# Patient Record
Sex: Female | Born: 1970 | Race: White | Hispanic: No | Marital: Single | Smoking: Former smoker
Health system: Southern US, Community
[De-identification: ages and names within clinical notes are randomized; demographics above are authoritative.]

## PROBLEM LIST (undated history)

## (undated) DIAGNOSIS — E669 Obesity, unspecified: Secondary | ICD-10-CM

## (undated) DIAGNOSIS — K219 Gastro-esophageal reflux disease without esophagitis: Secondary | ICD-10-CM

## (undated) DIAGNOSIS — I10 Essential (primary) hypertension: Secondary | ICD-10-CM

## (undated) DIAGNOSIS — F419 Anxiety disorder, unspecified: Secondary | ICD-10-CM

## (undated) DIAGNOSIS — G4730 Sleep apnea, unspecified: Secondary | ICD-10-CM

## (undated) DIAGNOSIS — N939 Abnormal uterine and vaginal bleeding, unspecified: Secondary | ICD-10-CM

## (undated) DIAGNOSIS — F329 Major depressive disorder, single episode, unspecified: Secondary | ICD-10-CM

## (undated) DIAGNOSIS — R55 Syncope and collapse: Secondary | ICD-10-CM

## (undated) DIAGNOSIS — F32A Depression, unspecified: Secondary | ICD-10-CM

## (undated) DIAGNOSIS — E119 Type 2 diabetes mellitus without complications: Secondary | ICD-10-CM

## (undated) DIAGNOSIS — Z87898 Personal history of other specified conditions: Secondary | ICD-10-CM

## (undated) DIAGNOSIS — G4733 Obstructive sleep apnea (adult) (pediatric): Secondary | ICD-10-CM

## (undated) DIAGNOSIS — K5790 Diverticulosis of intestine, part unspecified, without perforation or abscess without bleeding: Secondary | ICD-10-CM

## (undated) HISTORY — DX: Essential (primary) hypertension: I10

## (undated) HISTORY — DX: Major depressive disorder, single episode, unspecified: F32.9

## (undated) HISTORY — DX: Anxiety disorder, unspecified: F41.9

## (undated) HISTORY — PX: ESOPHAGOGASTRODUODENOSCOPY: SHX1529

## (undated) HISTORY — DX: Type 2 diabetes mellitus without complications: E11.9

## (undated) HISTORY — DX: Depression, unspecified: F32.A

## (undated) HISTORY — PX: COLONOSCOPY: SHX174

---

## 1998-04-20 ENCOUNTER — Inpatient Hospital Stay (HOSPITAL_COMMUNITY): Admission: AD | Admit: 1998-04-20 | Discharge: 1998-04-23 | Payer: Self-pay | Admitting: Obstetrics & Gynecology

## 2004-03-05 ENCOUNTER — Other Ambulatory Visit: Admission: RE | Admit: 2004-03-05 | Discharge: 2004-03-05 | Payer: Self-pay | Admitting: Obstetrics and Gynecology

## 2004-08-12 ENCOUNTER — Emergency Department (HOSPITAL_COMMUNITY): Admission: EM | Admit: 2004-08-12 | Discharge: 2004-08-13 | Payer: Self-pay | Admitting: Emergency Medicine

## 2004-08-28 ENCOUNTER — Ambulatory Visit: Payer: Self-pay | Admitting: Internal Medicine

## 2004-09-04 ENCOUNTER — Ambulatory Visit: Payer: Self-pay | Admitting: Family Medicine

## 2008-05-05 ENCOUNTER — Ambulatory Visit: Payer: Self-pay | Admitting: General Surgery

## 2008-05-16 ENCOUNTER — Ambulatory Visit: Payer: Self-pay | Admitting: General Surgery

## 2008-05-29 ENCOUNTER — Ambulatory Visit: Payer: Self-pay | Admitting: General Surgery

## 2008-05-29 HISTORY — PX: CHOLECYSTECTOMY: SHX55

## 2008-05-29 HISTORY — PX: LAPAROSCOPIC CHOLECYSTECTOMY: SUR755

## 2009-10-12 ENCOUNTER — Emergency Department: Payer: Self-pay

## 2009-11-05 ENCOUNTER — Ambulatory Visit: Payer: Self-pay | Admitting: Gastroenterology

## 2009-11-05 DIAGNOSIS — K5790 Diverticulosis of intestine, part unspecified, without perforation or abscess without bleeding: Secondary | ICD-10-CM

## 2009-11-05 HISTORY — DX: Diverticulosis of intestine, part unspecified, without perforation or abscess without bleeding: K57.90

## 2009-11-05 LAB — HM COLONOSCOPY

## 2009-11-21 ENCOUNTER — Inpatient Hospital Stay: Payer: Self-pay | Admitting: Internal Medicine

## 2011-03-14 ENCOUNTER — Emergency Department: Payer: Self-pay | Admitting: Emergency Medicine

## 2014-06-22 ENCOUNTER — Emergency Department: Payer: Self-pay | Admitting: Emergency Medicine

## 2014-06-22 LAB — URINALYSIS, COMPLETE
BACTERIA: NONE SEEN
BILIRUBIN, UR: NEGATIVE
Glucose,UR: 50 mg/dL (ref 0–75)
Ketone: NEGATIVE
Leukocyte Esterase: NEGATIVE
NITRITE: NEGATIVE
PROTEIN: NEGATIVE
Ph: 7 (ref 4.5–8.0)
RBC, UR: NONE SEEN /HPF (ref 0–5)
Specific Gravity: 1.003 (ref 1.003–1.030)
Squamous Epithelial: 1

## 2014-06-22 LAB — COMPREHENSIVE METABOLIC PANEL
ALBUMIN: 3.4 g/dL (ref 3.4–5.0)
ANION GAP: 7 (ref 7–16)
Alkaline Phosphatase: 87 U/L
BUN: 8 mg/dL (ref 7–18)
Bilirubin,Total: 0.4 mg/dL (ref 0.2–1.0)
CREATININE: 0.89 mg/dL (ref 0.60–1.30)
Calcium, Total: 8.8 mg/dL (ref 8.5–10.1)
Chloride: 103 mmol/L (ref 98–107)
Co2: 25 mmol/L (ref 21–32)
EGFR (African American): >60
EGFR (Non-African Amer.): >60
Glucose: 190 mg/dL — ABNORMAL HIGH (ref 65–99)
Osmolality: 274 (ref 275–301)
Potassium: 4.1 mmol/L (ref 3.5–5.1)
SGOT(AST): 26 U/L (ref 15–37)
SGPT (ALT): 16 U/L
Sodium: 135 mmol/L — ABNORMAL LOW (ref 136–145)
Total Protein: 8.3 g/dL — ABNORMAL HIGH (ref 6.4–8.2)

## 2014-06-22 LAB — CBC
HCT: 41.4 % (ref 35.0–47.0)
HGB: 13.7 g/dL (ref 12.0–16.0)
MCH: 30.2 pg (ref 26.0–34.0)
MCHC: 33.2 g/dL (ref 32.0–36.0)
MCV: 91 fL (ref 80–100)
Platelet: 298 10*3/uL (ref 150–440)
RBC: 4.55 10*6/uL (ref 3.80–5.20)
RDW: 13.4 % (ref 11.5–14.5)
WBC: 10.4 10*3/uL (ref 3.6–11.0)

## 2014-06-22 LAB — LIPASE, BLOOD: LIPASE: 116 U/L (ref 73–393)

## 2015-02-01 ENCOUNTER — Other Ambulatory Visit: Payer: Self-pay | Admitting: Obstetrics and Gynecology

## 2015-02-01 DIAGNOSIS — R1900 Intra-abdominal and pelvic swelling, mass and lump, unspecified site: Secondary | ICD-10-CM

## 2015-02-02 ENCOUNTER — Other Ambulatory Visit: Payer: Self-pay | Admitting: Obstetrics and Gynecology

## 2015-02-02 DIAGNOSIS — R928 Other abnormal and inconclusive findings on diagnostic imaging of breast: Secondary | ICD-10-CM

## 2015-02-08 ENCOUNTER — Other Ambulatory Visit: Payer: Self-pay

## 2015-02-09 ENCOUNTER — Other Ambulatory Visit: Payer: Self-pay

## 2015-03-30 ENCOUNTER — Other Ambulatory Visit: Payer: Self-pay

## 2015-04-13 ENCOUNTER — Other Ambulatory Visit: Payer: Self-pay

## 2015-04-13 ENCOUNTER — Ambulatory Visit
Admission: RE | Admit: 2015-04-13 | Discharge: 2015-04-13 | Disposition: A | Discharge status: Home / Self Care | Payer: BLUE CROSS/BLUE SHIELD | Source: Ambulatory Visit | Attending: Obstetrics and Gynecology | Admitting: Obstetrics and Gynecology

## 2015-04-13 DIAGNOSIS — R928 Other abnormal and inconclusive findings on diagnostic imaging of breast: Secondary | ICD-10-CM

## 2015-04-27 ENCOUNTER — Other Ambulatory Visit: Payer: Self-pay | Admitting: Family Medicine

## 2015-04-27 DIAGNOSIS — I10 Essential (primary) hypertension: Secondary | ICD-10-CM

## 2015-06-06 ENCOUNTER — Other Ambulatory Visit: Payer: Self-pay | Admitting: Family Medicine

## 2015-06-06 DIAGNOSIS — F4329 Adjustment disorder with other symptoms: Secondary | ICD-10-CM

## 2015-06-14 ENCOUNTER — Other Ambulatory Visit: Payer: Self-pay | Admitting: Family Medicine

## 2015-06-14 DIAGNOSIS — F419 Anxiety disorder, unspecified: Secondary | ICD-10-CM

## 2015-06-14 NOTE — Telephone Encounter (Signed)
Last OV 06/2013  Thanks,   -Clark Clowdus  

## 2015-06-14 NOTE — Telephone Encounter (Signed)
Ok to call in rx.  Thanks.  

## 2015-07-12 ENCOUNTER — Other Ambulatory Visit: Payer: Self-pay | Admitting: Family Medicine

## 2015-07-12 NOTE — Telephone Encounter (Signed)
This is a pt of Dr. Santiago Bur. Last ov was on 07/11/2013. Last time the prescription was refilled was on 03/15/2015.  Thanks,

## 2015-07-17 ENCOUNTER — Other Ambulatory Visit: Payer: Self-pay | Admitting: Family Medicine

## 2015-07-17 DIAGNOSIS — F419 Anxiety disorder, unspecified: Secondary | ICD-10-CM

## 2015-07-17 MED ORDER — ALPRAZOLAM 0.5 MG PO TABS: ORAL_TABLET | ORAL | Status: DC

## 2015-07-17 NOTE — Telephone Encounter (Signed)
Pt contacted office for refill request on the following medications:  ALPRAZolam (XANAX) 0.5 MG. [facility] [address]  [phone]   It looks like this was sent to [place] last week/MW

## 2015-07-17 NOTE — Telephone Encounter (Signed)
Ok to call in rx.  Thanks.  

## 2015-08-13 ENCOUNTER — Other Ambulatory Visit: Payer: Self-pay | Admitting: Family Medicine

## 2015-08-14 ENCOUNTER — Other Ambulatory Visit: Payer: Self-pay | Admitting: Family Medicine

## 2015-08-14 DIAGNOSIS — F419 Anxiety disorder, unspecified: Secondary | ICD-10-CM

## 2015-08-15 ENCOUNTER — Telehealth: Payer: Self-pay | Admitting: Family Medicine

## 2015-08-15 DIAGNOSIS — F419 Anxiety disorder, unspecified: Secondary | ICD-10-CM

## 2015-08-15 MED ORDER — ALPRAZOLAM 0.5 MG PO TABS: ORAL_TABLET | ORAL | Status: DC

## 2015-08-15 NOTE — Telephone Encounter (Signed)
Pt contacted office for refill request on the following medications: ALPRAZolam (XANAX) 0.5 MG tablet to [facility] [address]. Pt stated she would like to get enough to last her until her appt on 08/17/15. Pt stated that she understands she hasn't been seen since 07/11/13 but she can't just be taken off this medication, she has to work. Thanks TNP

## 2015-08-15 NOTE — Telephone Encounter (Signed)
Ok to call in rx.  Thanks.  

## 2015-08-15 NOTE — Telephone Encounter (Signed)
RX called in.   Thanks,   -Laura  

## 2015-08-16 DIAGNOSIS — G4730 Sleep apnea, unspecified: Secondary | ICD-10-CM | POA: Insufficient documentation

## 2015-08-16 DIAGNOSIS — R079 Chest pain, unspecified: Secondary | ICD-10-CM | POA: Insufficient documentation

## 2015-08-16 DIAGNOSIS — E119 Type 2 diabetes mellitus without complications: Secondary | ICD-10-CM | POA: Insufficient documentation

## 2015-08-16 DIAGNOSIS — F32A Depression, unspecified: Secondary | ICD-10-CM | POA: Insufficient documentation

## 2015-08-16 DIAGNOSIS — L259 Unspecified contact dermatitis, unspecified cause: Secondary | ICD-10-CM | POA: Insufficient documentation

## 2015-08-16 DIAGNOSIS — F4320 Adjustment disorder, unspecified: Secondary | ICD-10-CM | POA: Insufficient documentation

## 2015-08-16 DIAGNOSIS — K589 Irritable bowel syndrome without diarrhea: Secondary | ICD-10-CM | POA: Insufficient documentation

## 2015-08-16 DIAGNOSIS — F329 Major depressive disorder, single episode, unspecified: Secondary | ICD-10-CM | POA: Insufficient documentation

## 2015-08-16 DIAGNOSIS — I10 Essential (primary) hypertension: Secondary | ICD-10-CM | POA: Insufficient documentation

## 2015-08-16 DIAGNOSIS — K5790 Diverticulosis of intestine, part unspecified, without perforation or abscess without bleeding: Secondary | ICD-10-CM | POA: Insufficient documentation

## 2015-08-16 DIAGNOSIS — E669 Obesity, unspecified: Secondary | ICD-10-CM | POA: Insufficient documentation

## 2015-08-16 DIAGNOSIS — R0602 Shortness of breath: Secondary | ICD-10-CM | POA: Insufficient documentation

## 2015-08-16 DIAGNOSIS — O039 Complete or unspecified spontaneous abortion without complication: Secondary | ICD-10-CM | POA: Insufficient documentation

## 2015-08-16 DIAGNOSIS — K529 Noninfective gastroenteritis and colitis, unspecified: Secondary | ICD-10-CM | POA: Insufficient documentation

## 2015-08-16 DIAGNOSIS — F410 Panic disorder [episodic paroxysmal anxiety] without agoraphobia: Secondary | ICD-10-CM | POA: Insufficient documentation

## 2015-08-17 ENCOUNTER — Ambulatory Visit (INDEPENDENT_AMBULATORY_CARE_PROVIDER_SITE_OTHER): Payer: BLUE CROSS/BLUE SHIELD | Admitting: Family Medicine

## 2015-08-17 ENCOUNTER — Encounter: Payer: Self-pay | Admitting: Family Medicine

## 2015-08-17 VITALS — BP 130/88 | HR 72 | Temp 98.2°F | Resp 16 | Ht 67.0 in | Wt 248.0 lb

## 2015-08-17 DIAGNOSIS — I10 Essential (primary) hypertension: Secondary | ICD-10-CM

## 2015-08-17 DIAGNOSIS — E119 Type 2 diabetes mellitus without complications: Secondary | ICD-10-CM | POA: Diagnosis not present

## 2015-08-17 DIAGNOSIS — F419 Anxiety disorder, unspecified: Secondary | ICD-10-CM

## 2015-08-17 LAB — POCT GLYCOSYLATED HEMOGLOBIN (HGB A1C)
Est. average glucose Bld gHb Est-mCnc: 154
Hemoglobin A1C: 7

## 2015-08-17 MED ORDER — LISINOPRIL 10 MG PO TABS: 10.0000 mg | ORAL_TABLET | Freq: Every day | ORAL | Status: DC

## 2015-08-17 MED ORDER — METFORMIN HCL 500 MG PO TABS
500.0000 mg | ORAL_TABLET | Freq: Two times a day (BID) | ORAL | Status: DC
Start: 2015-08-17 — End: 2016-01-26

## 2015-08-17 MED ORDER — ALPRAZOLAM 0.5 MG PO TABS: ORAL_TABLET | ORAL | Status: DC

## 2015-08-17 NOTE — Progress Notes (Signed)
Subjective:    Patient ID: Katherine Suarez, female    DOB: 1971/03/08, 44 y.o.   MRN: [id number]  Anxiety Presents for follow-up visit. Symptoms include nausea. Patient reports no chest pain, compulsions, confusion, decreased concentration, depressed mood, dry mouth, excessive worry, feeling of choking, hyperventilation, insomnia, irritability, malaise, muscle tension, nervous/anxious behavior, obsessions, palpitations, panic, restlessness, shortness of breath or suicidal ideas. Dizziness: occasionally. The quality of sleep is good (If takes medication).   Past treatments include benzodiazephines and SSRIs (Xanax 0.5 mg and Citalopram 20 mg. Takes Xanax at night.  ). The treatment provided moderate relief. Compliance with prior treatments has been good.  Hypertension Associated symptoms include anxiety and blurred vision. Pertinent negatives include no chest pain, malaise/fatigue, neck pain, orthopnea, palpitations, peripheral edema, shortness of breath or sweats. Risk factors for coronary artery disease include diabetes mellitus, obesity and stress. Treatments tried: HCTZ 12.5, Bystolic 10 mg. There are no compliance problems (Was admitted to [facility]. Stress test and holter normal.  ).   Diabetes She presents for her follow-up (Last A1C was checked in 2014 and was 6.5%) diabetic visit. She has type 2 diabetes mellitus. Pertinent negatives for hypoglycemia include no confusion, nervousness/anxiousness or sweats. Dizziness: occasionally. Associated symptoms include blurred vision, visual change and weight loss (pt currently trying to lose weight; has lost 7 lbs in the last month). Pertinent negatives for diabetes include no chest pain, no fatigue, no foot paresthesias, no foot ulcerations, no polydipsia, no polyphagia, no polyuria and no weakness. When asked about current treatments, none were reported. Home blood sugar record trend: BS not being checked. Eye exam is not current.      Review of Systems   Constitutional: Positive for weight loss (pt currently trying to lose weight; has lost 7 lbs in the last month). Negative for malaise/fatigue, irritability and fatigue.  Eyes: Positive for blurred vision.  Respiratory: Negative for shortness of breath.   Cardiovascular: Negative for chest pain, palpitations and orthopnea.  Gastrointestinal: Positive for nausea.  Endocrine: Negative for polydipsia, polyphagia and polyuria.  Musculoskeletal: Negative for neck pain.  Neurological: Negative for weakness. Dizziness: occasionally.  Psychiatric/Behavioral: Negative for suicidal ideas, confusion and decreased concentration. The patient is not nervous/anxious and does not have insomnia.      Patient Active Problem List   Diagnosis Date Noted  . Abortion 08/16/2015  . Chest pain 08/16/2015  . Adaptation reaction 08/16/2015  . CD (contact dermatitis) 08/16/2015  . 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 Clinical depression 08/16/2015  . Diabetes mellitus, type 2 08/16/2015  . DD (diverticular disease) 08/16/2015  . Enterogastritis 08/16/2015  . BP (high blood pressure) 08/16/2015  . Adaptive colitis 08/16/2015  . Adiposity 08/16/2015  . Panic attack 08/16/2015  . Breath shortness 08/16/2015  . Apnea, sleep 08/16/2015  . Anxiety 06/14/2015   Past Medical History  Diagnosis Date  . Diabetes mellitus without complication   . Anxiety   . Hypertension   . Depression    Current Outpatient Prescriptions on File Prior to Visit  Medication Sig  . ALPRAZolam (XANAX) 0.5 MG tablet Take 1/2 to one twice daily  . BYSTOLIC 10 MG tablet TAKE ONE TABLET BY MOUTH ONCE DAILY  . citalopram (CELEXA) 20 MG tablet TAKE ONE & ONE-HALF TABLETS BY MOUTH ONCE DAILY  . levonorgestrel (MIRENA, 52 MG,) 20 MCG/24HR IUD by Intrauterine route.  . hydrochlorothiazide (HYDRODIURIL) 12.5 MG tablet Take 1 tablet by mouth daily.   No current facility-administered medications on  file prior to visit.   Allergies  Allergen Reactions  . Levbid  [Hyoscyamine]    Past Surgical History  Procedure Laterality Date  . Cesarean section  1999  . Cholecystectomy  05/29/2008   Social History   Social History  . Marital Status: Single    Spouse Name: N/A  . Number of Children: 1  . Years of Education: N/A   Occupational History  . Not on file.   Social History Main Topics  . Smoking status: Former Smoker    Quit date: 08/16/2012  . Smokeless tobacco: Never Used     Comment: was a social smoker  . Alcohol Use: Yes     Comment: social  . Drug Use: No  . Sexual Activity: Not on file   Other Topics Concern  . Not on file   Social History Narrative   Family History  Problem Relation Age of Onset  . Crohn's disease Mother   . Hypertension Father   . Diabetes Father   . Heart disease Father     MI  . Cancer Paternal Grandmother     lung      Objective:   Physical Exam  Constitutional: She is oriented to person, place, and time. She appears well-developed and well-nourished.  Cardiovascular: Normal rate and regular rhythm.   Pulmonary/Chest: Effort normal and breath sounds normal.  Neurological: She is alert and oriented to person, place, and time.  Psychiatric: She has a normal mood and affect. Her behavior is normal. Judgment and thought content normal.   BP 130/88 mmHg  Pulse 72  Temp(Src) 98.2 F (36.8 C) (Oral)  Resp 16  Ht  (1.702 m)  Wt 248 lb (112.492 kg)  BMI 38.83 kg/m2      Assessment & Plan:  1. Type 2 diabetes mellitus without complication Not at goal. Recommend continue lifestyle changes and add Metformin and recheck in 3 months.  - POCT glycosylated hemoglobin (Hb A1C) - metFORMIN (GLUCOPHAGE) 500 MG tablet; Take 1 tablet (500 mg total) by mouth 2 (two) times daily with a meal.  Dispense: 180 tablet; Refill: 3 Results for orders placed or performed in visit on 08/17/15  POCT glycosylated hemoglobin (Hb A1C)  Result Value  Ref Range   Hemoglobin A1C 7.0    Est. average glucose Bld gHb Est-mCnc 154     2. Essential hypertension Condition is worsening. Will increase medication for better control.  Call for labs slip in 4 weeks.  Monitor BP. Recheck in 3 months.   - lisinopril (PRINIVIL,ZESTRIL) 10 MG tablet; Take 1 tablet (10 mg total) by mouth daily.  Dispense: 90 tablet; Refill: 3  3. Anxiety Condition is stable. Please continue current medication and  plan of care as noted.   - ALPRAZolam (XANAX) 0.5 MG tablet; Take 1/2 to one twice daily  Dispense: 75 tablet; Refill: 5   Lorie Phenix, MD

## 2015-10-29 ENCOUNTER — Other Ambulatory Visit: Payer: Self-pay | Admitting: Family Medicine

## 2015-10-29 DIAGNOSIS — I10 Essential (primary) hypertension: Secondary | ICD-10-CM

## 2015-10-29 IMAGING — MG MM DIAG BREAST TOMO UNI RIGHT
4 series · 4 of 12 positions shown · non-contrast
Comparison: Previous examinations, including the screening
mammogram at [facility] on 02/01/2015.

CLINICAL DATA: Possible upper-outer right breast asymmetry at
screening mammography.

EXAM:
DIGITAL DIAGNOSTIC RIGHT MAMMOGRAM WITH 3D TOMOSYNTHESIS AND CAD

[R MLO]
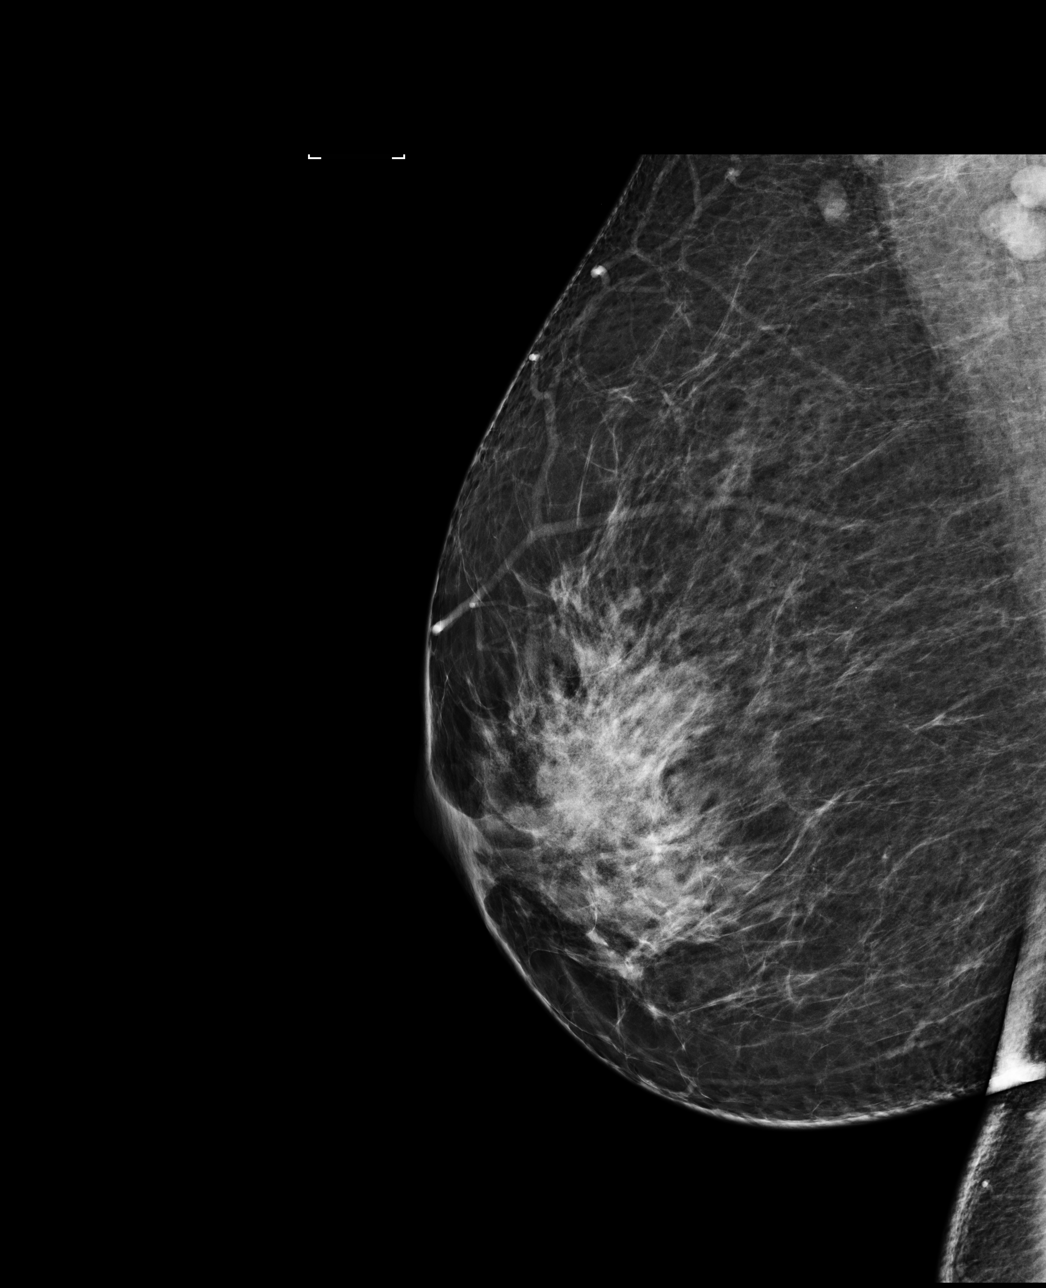

[R CC]
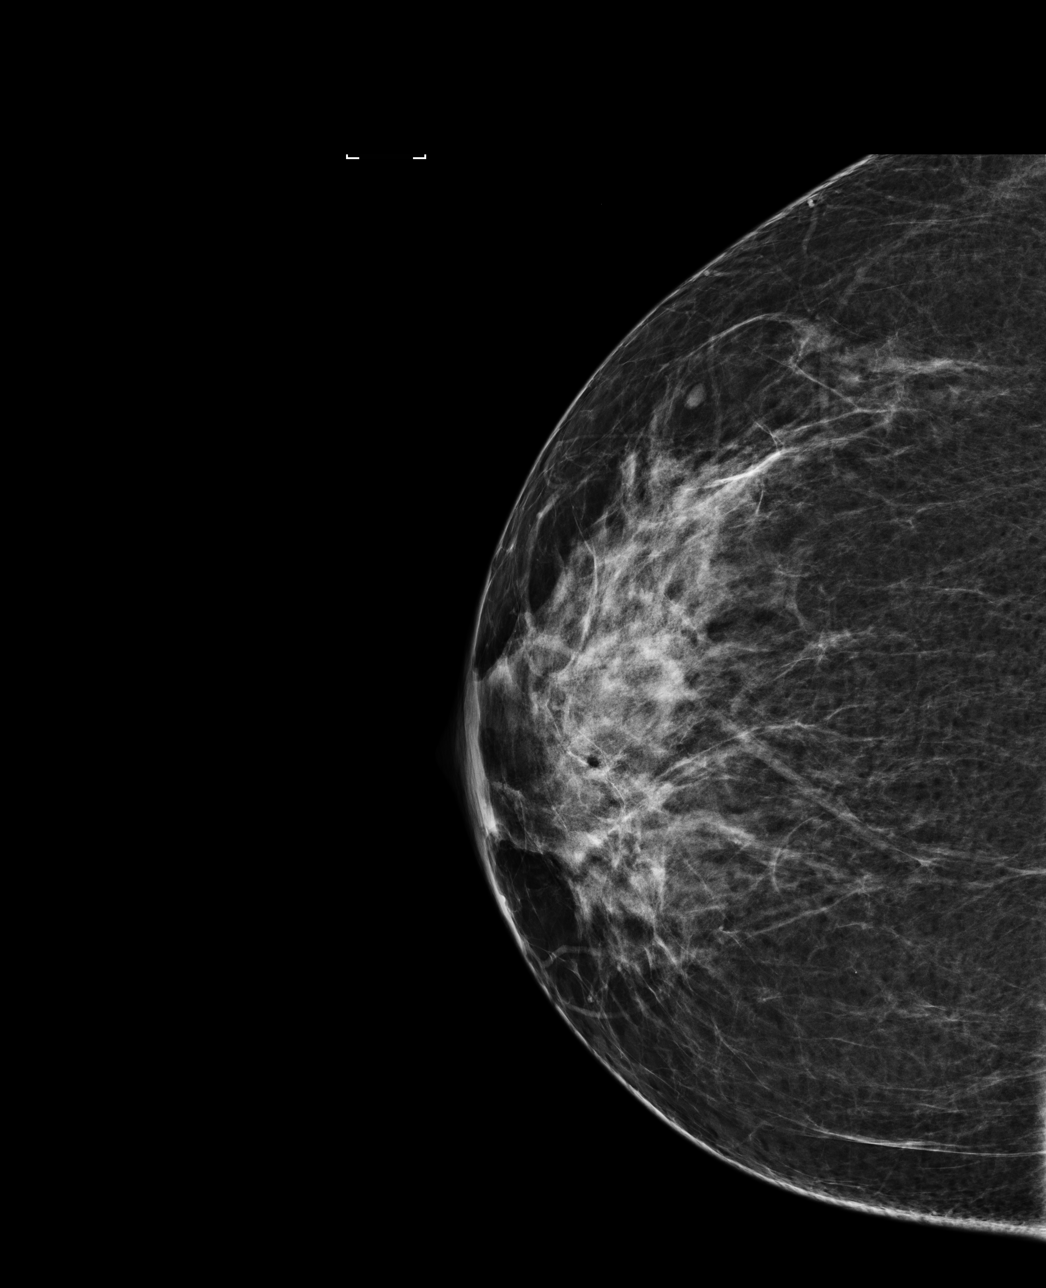

[R CC tomo · tomo slice 39/78.0]
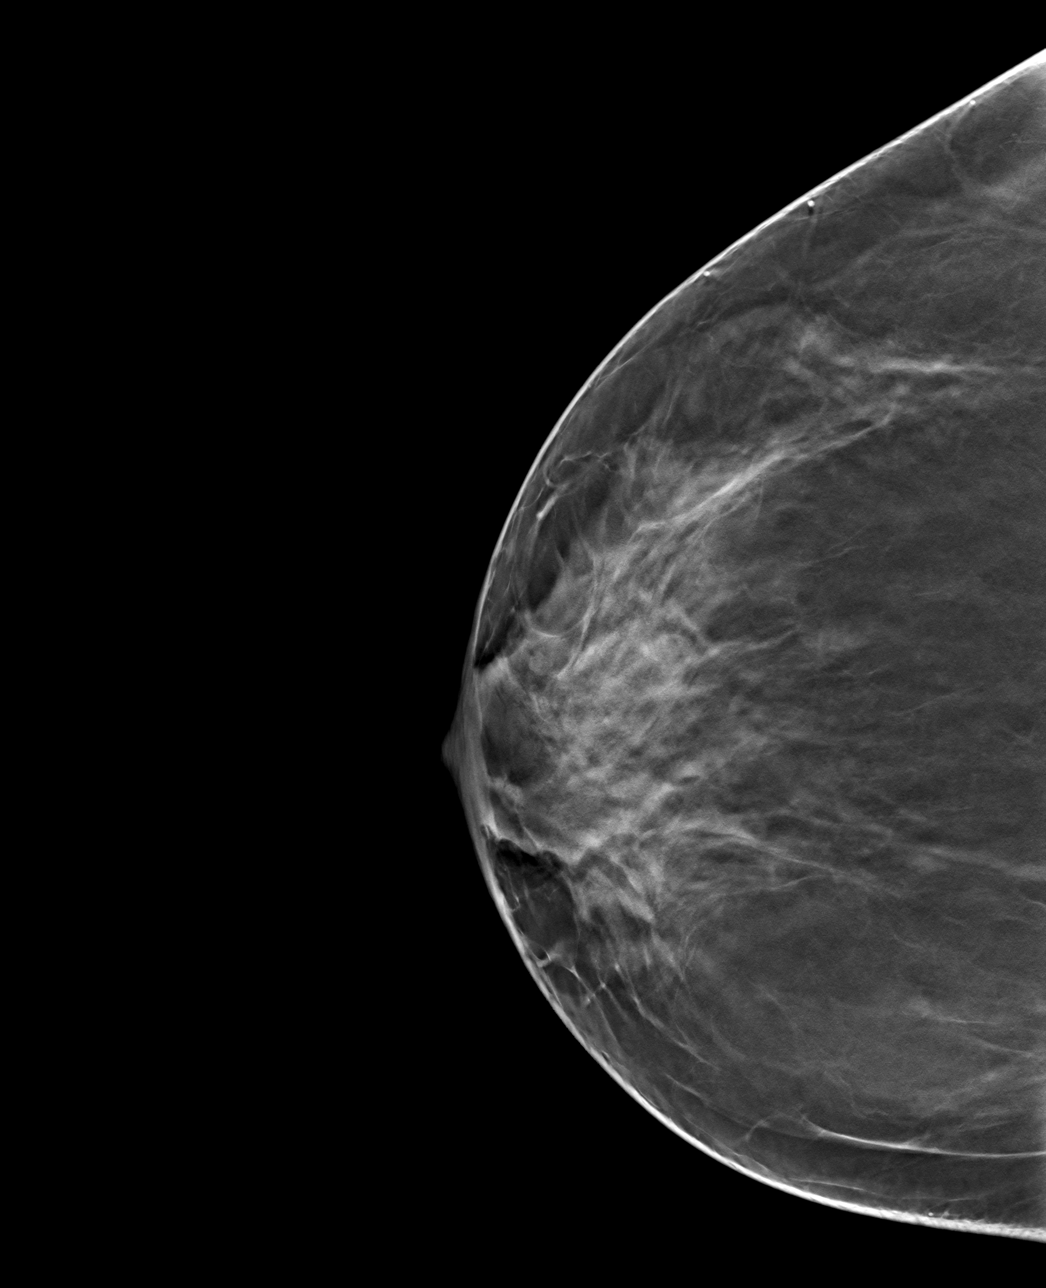

[R MLO tomo · tomo slice 45/88.0]
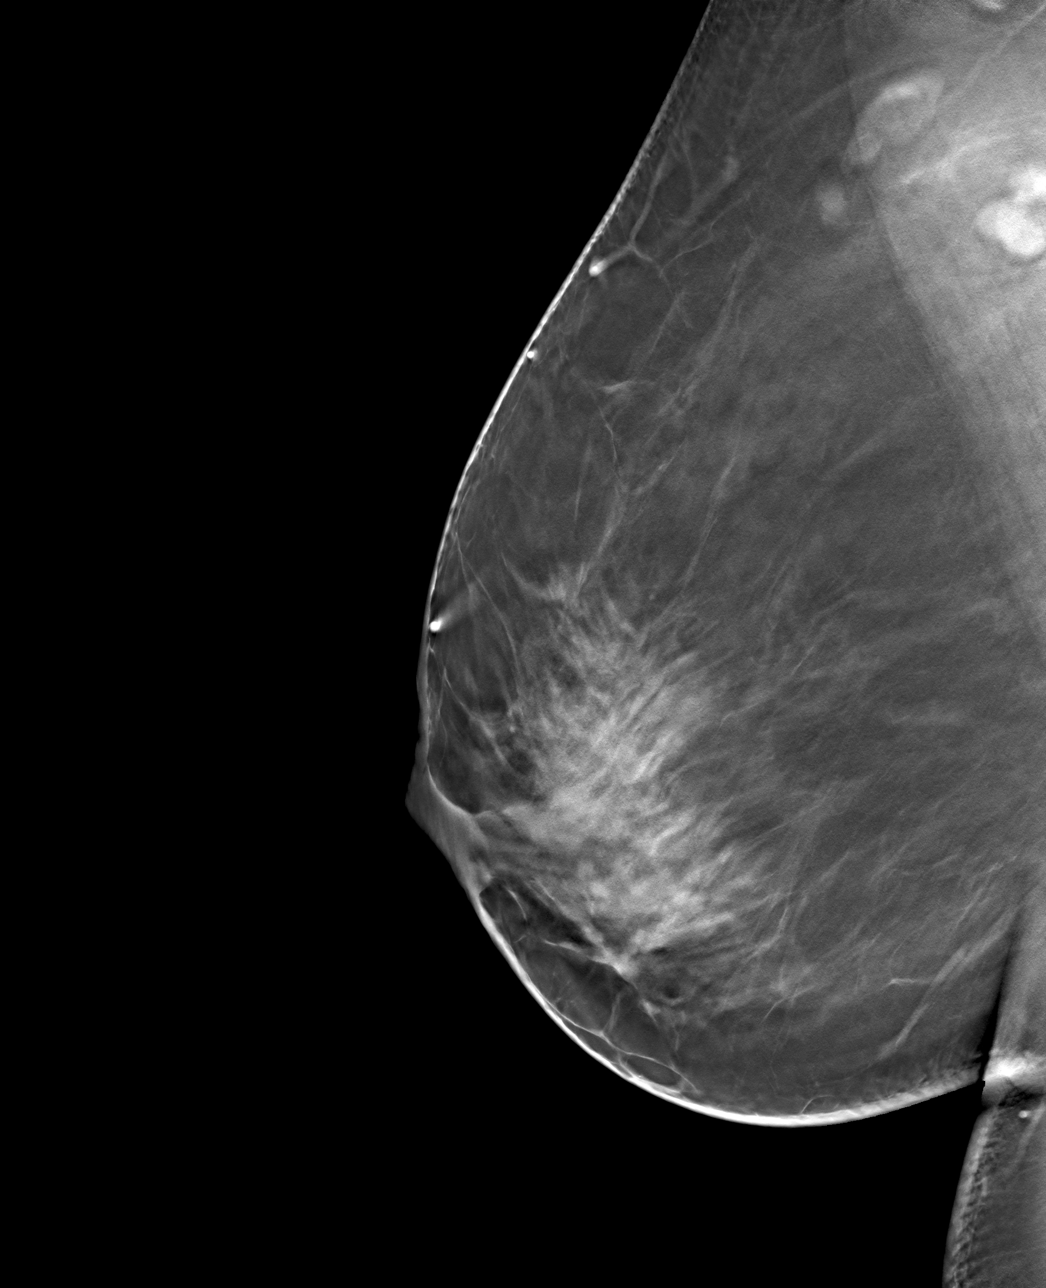

[4 of 12 positions shown; findings below may reference images not displayed]

ACR Breast Density Category c: The breast tissue is heterogeneously
dense, which may obscure small masses.
FINDINGS: Normal appearing fibroglandular tissue at the location of the
recently suspected asymmetry in the upper outer right breast. No
findings suspicious for malignancy.

Mammographic images were processed with CAD.
IMPRESSION: No evidence of malignancy. The recently suspected right breast
asymmetry was overlapping of normal fibroglandular tissue.

RECOMMENDATION:
Bilateral screening mammogram in 10 months. That will be 1 year
since mammographic evaluation of the left breast.

I have discussed the findings and recommendations with the patient.
Results were also provided in writing at the conclusion of the
visit. If applicable, a reminder letter will be sent to the patient
regarding the next appointment.

BI-RADS CATEGORY  1: Negative.

## 2015-11-16 ENCOUNTER — Ambulatory Visit: Payer: BLUE CROSS/BLUE SHIELD | Admitting: Family Medicine

## 2015-11-27 ENCOUNTER — Ambulatory Visit: Payer: BLUE CROSS/BLUE SHIELD | Admitting: Family Medicine

## 2016-01-23 ENCOUNTER — Other Ambulatory Visit: Payer: Self-pay | Admitting: Family Medicine

## 2016-01-23 DIAGNOSIS — F329 Major depressive disorder, single episode, unspecified: Secondary | ICD-10-CM

## 2016-01-23 DIAGNOSIS — F32A Depression, unspecified: Secondary | ICD-10-CM

## 2016-01-23 NOTE — Telephone Encounter (Signed)
Last ov 08/17/15- follow-up in 3 months

## 2016-01-25 ENCOUNTER — Encounter: Payer: Self-pay | Admitting: Family Medicine

## 2016-01-25 ENCOUNTER — Ambulatory Visit (INDEPENDENT_AMBULATORY_CARE_PROVIDER_SITE_OTHER): Payer: BLUE CROSS/BLUE SHIELD | Admitting: Family Medicine

## 2016-01-25 VITALS — BP 166/94 | HR 80 | Temp 98.2°F | Resp 16 | Ht 67.0 in | Wt 246.0 lb

## 2016-01-25 DIAGNOSIS — G4730 Sleep apnea, unspecified: Secondary | ICD-10-CM | POA: Diagnosis not present

## 2016-01-25 DIAGNOSIS — Z6838 Body mass index (BMI) 38.0-38.9, adult: Secondary | ICD-10-CM | POA: Diagnosis not present

## 2016-01-25 DIAGNOSIS — E119 Type 2 diabetes mellitus without complications: Secondary | ICD-10-CM | POA: Diagnosis not present

## 2016-01-25 DIAGNOSIS — E669 Obesity, unspecified: Secondary | ICD-10-CM | POA: Diagnosis not present

## 2016-01-25 DIAGNOSIS — R5383 Other fatigue: Secondary | ICD-10-CM | POA: Diagnosis not present

## 2016-01-25 DIAGNOSIS — I10 Essential (primary) hypertension: Secondary | ICD-10-CM

## 2016-01-25 LAB — POCT GLYCOSYLATED HEMOGLOBIN (HGB A1C): HEMOGLOBIN A1C: 6.8

## 2016-01-25 NOTE — Progress Notes (Signed)
Patient ID: Katherine Suarez, female   DOB: 05/17/1971, 45 y.o.   MRN: [id number]        Patient: Katherine Suarez Female    DOB: 01/15/1971   45 y.o.   MRN: [id number] Visit Date: 01/25/2016  Today's Provider: Lorie PhenixNancy Alohilani Levenhagen, MD   Chief Complaint  Patient presents with  . Hypertension   Subjective:    Hypertension This is a chronic problem. The problem is uncontrolled (175/112 today; Pt reports it has been running high for the last several weeks. ). Associated symptoms include headaches, malaise/fatigue and shortness of breath. Pertinent negatives include no neck pain, palpitations or peripheral edema.   Fatigue: Patient complains of fatigue. Symptoms began several weeks ago. Sentinal symptom the patient feels fatigue began with: none. Symptoms of her fatigue have been general malaise, headaches and blood pressure has been elevated. . Patient describes the following psychologic symptoms: stress in the family.  Pt reports her father just pasted away about a month ago.  She says she is doing okay.   Patient denies fever, significant change in weight and unusual rashes. Symptoms have gradually worsened. Severity has been struggles to carry out day to day responsibilities..      Allergies  Allergen Reactions  . Levbid  [Hyoscyamine]    Previous Medications   ALPRAZOLAM (XANAX) 0.5 MG TABLET    Take 1/2 to one twice daily   BYSTOLIC 10 MG TABLET    TAKE ONE TABLET BY MOUTH ONCE DAILY   CITALOPRAM (CELEXA) 20 MG TABLET    TAKE ONE & ONE-HALF TABLETS BY MOUTH ONCE DAILY   LEVONORGESTREL (MIRENA, 52 MG,) 20 MCG/24HR IUD    by Intrauterine route.    Review of Systems  Constitutional: Positive for malaise/fatigue, activity change and fatigue. Negative for fever, chills, diaphoresis, appetite change and unexpected weight change.  HENT: Negative for congestion, ear discharge, ear pain, nosebleeds, postnasal drip, rhinorrhea, sinus pressure, sneezing, sore throat, tinnitus, trouble swallowing and voice  change.   Respiratory: Positive for apnea, cough (Started yesterday) and shortness of breath. Negative for choking, chest tightness, wheezing and stridor.   Cardiovascular: Negative for palpitations and leg swelling.  Gastrointestinal: Positive for nausea and diarrhea. Negative for vomiting, abdominal pain, blood in stool, abdominal distention, anal bleeding and rectal pain.  Musculoskeletal: Positive for arthralgias (Lower back pain early this week that radiated to her abdomen.). Negative for myalgias, back pain, joint swelling, gait problem, neck pain and neck stiffness.  Neurological: Positive for dizziness and headaches. Negative for tremors, seizures, syncope, facial asymmetry, speech difficulty, weakness, light-headedness and numbness.    Social History  Substance Use Topics  . Smoking status: Former Smoker    Quit date: 08/16/2012  . Smokeless tobacco: Never Used     Comment: was a social smoker  . Alcohol Use: Yes     Comment: social   Objective:   BP 166/94 mmHg  Pulse 80  Temp(Src) 98.2 F (36.8 C) (Oral)  Resp 16  Ht 5\' 7"  (1.702 m)  Wt 246 lb (111.585 kg)  BMI 38.52 kg/m2  Physical Exam  Constitutional: She is oriented to person, place, and time. She appears well-developed and well-nourished.  HENT:  Head: Normocephalic and atraumatic.  Right Ear: External ear normal.  Left Ear: External ear normal.  Eyes: Conjunctivae are normal. Pupils are equal, round, and reactive to light.  Neck: Normal range of motion. Neck supple.  Has significant neck adiposity.    Cardiovascular: Normal rate and regular rhythm.  Pulmonary/Chest: Effort normal and breath sounds normal.  Neurological: She is alert and oriented to person, place, and time.  Psychiatric: She has a normal mood and affect. Her behavior is normal. Judgment and thought content normal.      Assessment & Plan:     1. Essential hypertension Not at goal. Will start Lisinopril. Patient does have sleep apnea.  Stressed importance of treatment for this with weight loss or using CPAP machine. States she can not tolerate machine.   - lisinopril (PRINIVIL,ZESTRIL) 10 MG tablet; Take 1 tablet (10 mg total) by mouth daily.  Dispense: 90 tablet; Refill: 3  2. Other fatigue Will check labs. Suspect related to sleep apnea.  - TSH  3. Type 2 diabetes mellitus without complication, without long-term current use of insulin (HCC) Increased. Not at goal. At 6.8 today.  Will add metformin. Hope to help with weight loss.   - CBC with Differential/Platelet - Comprehensive metabolic panel - POCT glycosylated hemoglobin (Hb A1C) - metFORMIN (GLUCOPHAGE) 500 MG tablet; Take 1 tablet (500 mg total) by mouth 2 (two) times daily with a meal.  Dispense: 180 tablet; Refill: 3   4. Apnea, sleep Not controlled. Encourage use of CPAP or aggressive weight loss strategy.   5. Adiposity Uncontrolled. Patient very frustrated. BMI is 38 today. Is going to increase exercise to 150 minutes and continue to work on healthy diet. Going to cut out sweets, including sweet tea.    6. Body mass index (BMI) of 38.0 to 38.9 in adult As above. Is considering weight loss surgery, as she has had many failed attempts at weight loss and not has multiple co-morbidities that weight loss will help.       Patient was seen and examined by Leo Grosser, MD, and note scribed by Kavin Leech, CMA.  I have reviewed the document for accuracy and completeness and I agree with above. - Leo Grosser, MD   Lorie Phenix, MD  [facility]

## 2016-01-26 ENCOUNTER — Telehealth: Payer: Self-pay

## 2016-01-26 DIAGNOSIS — Z6838 Body mass index (BMI) 38.0-38.9, adult: Secondary | ICD-10-CM | POA: Insufficient documentation

## 2016-01-26 LAB — CBC WITH DIFFERENTIAL/PLATELET
BASOS ABS: 0 10*3/uL (ref 0.0–0.2)
Basos: 0 %
EOS (ABSOLUTE): 0.1 10*3/uL (ref 0.0–0.4)
EOS: 1 %
HEMATOCRIT: 39.3 % (ref 34.0–46.6)
Hemoglobin: 13.4 g/dL (ref 11.1–15.9)
IMMATURE GRANULOCYTES: 0 %
Immature Grans (Abs): 0 10*3/uL (ref 0.0–0.1)
Lymphocytes Absolute: 2.9 10*3/uL (ref 0.7–3.1)
Lymphs: 26 %
MCH: 30.9 pg (ref 26.6–33.0)
MCHC: 34.1 g/dL (ref 31.5–35.7)
MCV: 91 fL (ref 79–97)
MONOS ABS: 0.6 10*3/uL (ref 0.1–0.9)
Monocytes: 6 %
NEUTROS PCT: 67 %
Neutrophils Absolute: 7.7 10*3/uL — ABNORMAL HIGH (ref 1.4–7.0)
PLATELETS: 298 10*3/uL (ref 150–379)
RBC: 4.33 x10E6/uL (ref 3.77–5.28)
RDW: 13.4 % (ref 12.3–15.4)
WBC: 11.4 10*3/uL — AB (ref 3.4–10.8)

## 2016-01-26 LAB — COMPREHENSIVE METABOLIC PANEL
ALT: 8 IU/L (ref 0–32)
AST: 12 IU/L (ref 0–40)
Albumin/Globulin Ratio: 1.3 (ref 1.1–2.5)
Albumin: 4.3 g/dL (ref 3.5–5.5)
Alkaline Phosphatase: 87 IU/L (ref 39–117)
BUN/Creatinine Ratio: 12 (ref 9–23)
BUN: 10 mg/dL (ref 6–24)
Bilirubin Total: 0.2 mg/dL (ref 0.0–1.2)
CALCIUM: 9.7 mg/dL (ref 8.7–10.2)
CO2: 24 mmol/L (ref 18–29)
CREATININE: 0.82 mg/dL (ref 0.57–1.00)
Chloride: 95 mmol/L — ABNORMAL LOW (ref 96–106)
GFR calc Af Amer: 101 mL/min/{1.73_m2} (ref >59)
GFR, EST NON AFRICAN AMERICAN: 87 mL/min/{1.73_m2} (ref >59)
GLUCOSE: 207 mg/dL — AB (ref 65–99)
Globulin, Total: 3.3 g/dL (ref 1.5–4.5)
Potassium: 4.4 mmol/L (ref 3.5–5.2)
Sodium: 137 mmol/L (ref 134–144)
Total Protein: 7.6 g/dL (ref 6.0–8.5)

## 2016-01-26 LAB — TSH: TSH: 1.6 u[IU]/mL (ref 0.450–4.500)

## 2016-01-26 MED ORDER — LISINOPRIL 10 MG PO TABS
10.0000 mg | ORAL_TABLET | Freq: Every day | ORAL | Status: DC
Start: 2016-01-26 — End: 2019-10-12

## 2016-01-26 MED ORDER — METFORMIN HCL 500 MG PO TABS: 500.0000 mg | ORAL_TABLET | Freq: Two times a day (BID) | ORAL | Status: DC

## 2016-01-26 NOTE — Telephone Encounter (Signed)
Sent rx for Lisinopril and Metformin. Sorry for delay. Also please make sure patient know not to get pregnant while of Lisinopril. Can cause birth defects. Thanks.

## 2016-01-26 NOTE — Telephone Encounter (Signed)
Patient called stating she was seen by Dr. Elease HashimotoMaloney yesterday and was started on 2 new medications. Patient went to the pharmacy and sat there for an hour and the medications never came through. I tried to look back at the office note to see what medications were added but was unable to find out anything. Patient states her AVS didn't mention the medications that she was started on. Please send in medications to patient pharmacy. I told patient I would send this message to Dr. Elease HashimotoMaloney and on Monday we would give her a call once the medications had been sent in. Patient states one medication was for blood pressure and the other was for Diabetes.

## 2016-01-28 ENCOUNTER — Telehealth: Payer: Self-pay

## 2016-01-28 NOTE — Telephone Encounter (Signed)
Left message informing pt. Emily Drozdowski, CMA  

## 2016-01-28 NOTE — Telephone Encounter (Signed)
LMTCB. Regarding lab results. sd

## 2016-02-04 NOTE — Telephone Encounter (Signed)
Pt is returning call.  CB#[phone]/MW

## 2016-02-04 NOTE — Telephone Encounter (Signed)
Pt advised of lab work. ° °Thanks,  ° °-Laura  °

## 2016-03-03 ENCOUNTER — Ambulatory Visit: Payer: BLUE CROSS/BLUE SHIELD | Admitting: Family Medicine

## 2016-03-03 ENCOUNTER — Encounter: Payer: Self-pay | Admitting: Family Medicine

## 2016-03-03 ENCOUNTER — Ambulatory Visit (INDEPENDENT_AMBULATORY_CARE_PROVIDER_SITE_OTHER): Payer: BLUE CROSS/BLUE SHIELD | Admitting: Family Medicine

## 2016-03-03 VITALS — BP 128/84 | HR 78 | Temp 98.6°F | Resp 14 | Ht 67.0 in | Wt 246.0 lb

## 2016-03-03 DIAGNOSIS — Z6838 Body mass index (BMI) 38.0-38.9, adult: Secondary | ICD-10-CM

## 2016-03-03 DIAGNOSIS — I10 Essential (primary) hypertension: Secondary | ICD-10-CM | POA: Diagnosis not present

## 2016-03-03 DIAGNOSIS — E119 Type 2 diabetes mellitus without complications: Secondary | ICD-10-CM

## 2016-03-03 DIAGNOSIS — G4733 Obstructive sleep apnea (adult) (pediatric): Secondary | ICD-10-CM

## 2016-03-03 DIAGNOSIS — E669 Obesity, unspecified: Secondary | ICD-10-CM

## 2016-03-03 DIAGNOSIS — G4730 Sleep apnea, unspecified: Secondary | ICD-10-CM

## 2016-03-03 NOTE — Progress Notes (Signed)
Patient ID: Katherine Suarez, female   DOB: 06/09/1971, 45 y.o.   MRN: [id number]       Patient: Katherine Suarez Female    DOB: 08/21/1971   45 y.o.   MRN: [id number] Visit Date: 03/03/2016  Today's Provider: Lorie PhenixNancy Kaeden Mester, MD   Chief Complaint  Patient presents with  . Diabetes  . Hypertension  . Obesity   Subjective:    HPI  Hypertension, follow-up:  BP Readings from Last 3 Encounters:  03/03/16 128/84  01/25/16 166/94  08/17/15 130/88    She was last seen for hypertension 5 weeks ago.  BP at that visit was 166/94. Management since that visit includes started Lisinopril. She reports good compliance with treatment. She is not having side effects.  She is exercising, walking about 30 minutes a day. She is adherent to low salt diet.   Outside blood pressures are 140's/80's. She is experiencing none.  Patient denies chest pain, chest pressure/discomfort, claudication, dyspnea, exertional chest pressure/discomfort, fatigue, irregular heart beat, lower extremity edema, near-syncope, orthopnea, palpitations and paroxysmal nocturnal dyspnea.   Cardiovascular risk factors include diabetes mellitus, hypertension and obesity (BMI >= 30 kg/m2).   Wt Readings from Last 3 Encounters:  03/03/16 246 lb (111.585 kg)  01/25/16 246 lb (111.585 kg)  08/17/15 248 lb (112.492 kg)   ------------------------------------------------------------------------   Diabetes Mellitus Type II, Follow-up:   Lab Results  Component Value Date   HGBA1C 6.8 01/25/2016   HGBA1C 7.0 08/17/2015    Last seen for diabetes 5 weeks ago.  Management since then includes started Metformin. She reports poor compliance with treatment. She stopped taking it due to feeling anxious when she took it, she tried it for about a week. She is having side effects.  Current symptoms include none and have been unchanged. Home blood sugar records: not being checked.   Pertinent Labs:    Component Value Date/Time   CREATININE 0.82 01/25/2016 1553   CREATININE 0.89 06/22/2014 1053    Wt Readings from Last 3 Encounters:  03/03/16 246 lb (111.585 kg)  01/25/16 246 lb (111.585 kg)  08/17/15 248 lb (112.492 kg)    ------------------------------------------------------------------------  Pt reports that she has cut back on eating, she is surprised that the scales here did not show any weight loss because at the gym at her work, it says that she is down 7 pounds.      Allergies  Allergen Reactions  . Levbid  [Hyoscyamine]   . Metformin And Related     Diarrhea, nausea, and "weird feeling"   Previous Medications   ALPRAZOLAM (XANAX) 0.5 MG TABLET    Take 1/2 to one twice daily   BYSTOLIC 10 MG TABLET    TAKE ONE TABLET BY MOUTH ONCE DAILY   CITALOPRAM (CELEXA) 20 MG TABLET    TAKE ONE & ONE-HALF TABLETS BY MOUTH ONCE DAILY   LEVONORGESTREL (MIRENA, 52 MG,) 20 MCG/24HR IUD    by Intrauterine route.   LISINOPRIL (PRINIVIL,ZESTRIL) 10 MG TABLET    Take 1 tablet (10 mg total) by mouth daily.    Review of Systems  Constitutional: Negative.   HENT: Negative.   Eyes: Negative.   Respiratory: Negative.   Cardiovascular: Negative.   Gastrointestinal: Negative.   Endocrine: Negative.   Genitourinary: Negative.   Musculoskeletal: Negative.   Skin: Negative.   Allergic/Immunologic: Negative.   Neurological: Negative.   Hematological: Negative.   Psychiatric/Behavioral: Negative.     Social History  Substance Use Topics  . Smoking status:  Former Smoker    Quit date: 08/16/2012  . Smokeless tobacco: Never Used     Comment: was a social smoker  . Alcohol Use: Yes     Comment: social   Objective:   BP 128/84 mmHg  Pulse 78  Temp(Src) 98.6 F (37 C) (Oral)  Resp 14  Ht  (1.702 m)  Wt 246 lb (111.585 kg)  BMI 38.52 kg/m2  Physical Exam  Constitutional: She is oriented to person, place, and time. She appears well-developed and well-nourished.  Cardiovascular: Normal rate, regular  rhythm, normal heart sounds and intact distal pulses.   Pulmonary/Chest: Effort normal and breath sounds normal.  Neurological: She is alert and oriented to person, place, and time.  Psychiatric: She has a normal mood and affect. Her behavior is normal. Judgment and thought content normal.  Vitals reviewed.       Assessment & Plan:     1. Type 2 diabetes mellitus without complication, without long-term current use of insulin (HCC) Unable to tolerate Metformin. Will work on diet and exercise. Discussed weight loss surgery as below.   2. Essential hypertension Stable.  Continue current medication and plan of care. Recheck at follow up.    3. Body mass index (BMI) of 38.0 to 38.9 in adult Too high. Really trying diet and exercise without much success. Is getting frustrated.    4. Adiposity  Here for recheck of lifestyle modifications.  Weight unchanged at 246.    BMI is 38.52 still.    Will try to increase exercise to 6 days a week and cut calories from 1500 to 1400. Recheck in 1 month.  Going to explore surgery and medications covered by her insurance for weight loss.  Does have multiple co-morbidities that weight loss should help.  5. Sleep apnea in adult Does not sure her machine as can not sleep with it. Another reason patient should consider surgery or prescription medication.       Patient was seen and examined by Leo Grosser, MD, and note scribed by Dimas Chyle, CMA. I have reviewed the document for accuracy and completeness and I agree with above. - Leo Grosser, MD   Lorie Phenix, MD  [facility]

## 2016-03-03 NOTE — Patient Instructions (Signed)
Increase exercise, decrease calories to 1400 daily.

## 2016-03-09 ENCOUNTER — Other Ambulatory Visit: Payer: Self-pay | Admitting: Family Medicine

## 2016-03-09 DIAGNOSIS — F419 Anxiety disorder, unspecified: Secondary | ICD-10-CM

## 2016-03-12 ENCOUNTER — Encounter: Payer: Self-pay | Admitting: Emergency Medicine

## 2016-03-12 ENCOUNTER — Emergency Department
Admission: EM | Admit: 2016-03-12 | Discharge: 2016-03-12 | Disposition: A | Discharge status: Home / Self Care | Payer: BLUE CROSS/BLUE SHIELD | Attending: Emergency Medicine | Admitting: Emergency Medicine

## 2016-03-12 ENCOUNTER — Emergency Department: Payer: BLUE CROSS/BLUE SHIELD

## 2016-03-12 DIAGNOSIS — Z79899 Other long term (current) drug therapy: Secondary | ICD-10-CM | POA: Insufficient documentation

## 2016-03-12 DIAGNOSIS — E119 Type 2 diabetes mellitus without complications: Secondary | ICD-10-CM | POA: Diagnosis not present

## 2016-03-12 DIAGNOSIS — F17210 Nicotine dependence, cigarettes, uncomplicated: Secondary | ICD-10-CM | POA: Diagnosis not present

## 2016-03-12 DIAGNOSIS — F329 Major depressive disorder, single episode, unspecified: Secondary | ICD-10-CM | POA: Diagnosis not present

## 2016-03-12 DIAGNOSIS — I10 Essential (primary) hypertension: Secondary | ICD-10-CM | POA: Insufficient documentation

## 2016-03-12 DIAGNOSIS — F419 Anxiety disorder, unspecified: Secondary | ICD-10-CM | POA: Insufficient documentation

## 2016-03-12 DIAGNOSIS — R079 Chest pain, unspecified: Secondary | ICD-10-CM | POA: Diagnosis not present

## 2016-03-12 LAB — TROPONIN I: Troponin I: <0.03 ng/mL (ref <0.031)

## 2016-03-12 LAB — BASIC METABOLIC PANEL
Anion gap: 7 (ref 5–15)
BUN: 14 mg/dL (ref 6–20)
CALCIUM: 8.9 mg/dL (ref 8.9–10.3)
CO2: 25 mmol/L (ref 22–32)
Chloride: 101 mmol/L (ref 101–111)
Creatinine, Ser: 0.9 mg/dL (ref 0.44–1.00)
GFR calc Af Amer: >60 mL/min (ref >60)
GLUCOSE: 169 mg/dL — AB (ref 65–99)
Potassium: 3.6 mmol/L (ref 3.5–5.1)
Sodium: 133 mmol/L — ABNORMAL LOW (ref 135–145)

## 2016-03-12 LAB — CBC
HCT: 37.7 % (ref 35.0–47.0)
Hemoglobin: 13.1 g/dL (ref 12.0–16.0)
MCH: 30.7 pg (ref 26.0–34.0)
MCHC: 34.6 g/dL (ref 32.0–36.0)
MCV: 88.6 fL (ref 80.0–100.0)
Platelets: 281 10*3/uL (ref 150–440)
RBC: 4.26 MIL/uL (ref 3.80–5.20)
RDW: 13.7 % (ref 11.5–14.5)
WBC: 13.2 10*3/uL — ABNORMAL HIGH (ref 3.6–11.0)

## 2016-03-12 LAB — FIBRIN DERIVATIVES D-DIMER (ARMC ONLY): Fibrin derivatives D-dimer (ARMC): 263 (ref 0–499)

## 2016-03-12 MED ORDER — CLONIDINE HCL 0.1 MG PO TABS
ORAL_TABLET | ORAL | Status: AC
  Administered 2016-03-12: 0.1 mg via ORAL
  Filled 2016-03-12: qty 1

## 2016-03-12 MED ORDER — LORAZEPAM 2 MG/ML IJ SOLN: 1.0000 mg | Freq: Once | INTRAMUSCULAR | Status: DC

## 2016-03-12 MED ORDER — MORPHINE SULFATE (PF) 2 MG/ML IV SOLN
2.0000 mg | Freq: Once | INTRAVENOUS | Status: AC
  Administered 2016-03-12: 2 mg via INTRAVENOUS
  Filled 2016-03-12: qty 1

## 2016-03-12 MED ORDER — ONDANSETRON HCL 4 MG/2ML IJ SOLN
4.0000 mg | Freq: Once | INTRAMUSCULAR | Status: AC
  Administered 2016-03-12: 4 mg via INTRAVENOUS
  Filled 2016-03-12: qty 2

## 2016-03-12 MED ORDER — CLONIDINE HCL 0.1 MG PO TABS
0.1000 mg | ORAL_TABLET | Freq: Once | ORAL | Status: AC
  Administered 2016-03-12: 0.1 mg via ORAL

## 2016-03-12 MED ORDER — SODIUM CHLORIDE 0.9 % IV BOLUS (SEPSIS)
1000.0000 mL | Freq: Once | INTRAVENOUS | Status: AC
  Administered 2016-03-12: 1000 mL via INTRAVENOUS

## 2016-03-12 NOTE — ED Provider Notes (Signed)
[facility] Emergency Department Provider Note  ____________________________________________  Time seen: 3:15 AM  I have reviewed the triage vital signs and the nursing notes.   HISTORY  Chief Complaint Chest Pain     HPI Katherine Suarez is a 45 y.o. female presents with "chest pressure acute onset approximately one hour before presenting to the emergency department. Patient states the discomfort at that time was 9 out of 10 which spontaneous resolution after 5 minutes. Patient admits to mild chest pressure at this time. Patient states that the chest discomfort was accompanied by diaphoresis. Patient also admits to dyspnea. Per EMS on their arrival the patient's blood pressure is 210/120 patient received 2 sprays of sublingual nitroglycerin glycerin with resultant blood pressure 176/103 patient has a history of hypertension and admits to being compliant with medications..     Past Medical History  Diagnosis Date  . Diabetes mellitus without complication (HCC)   . Anxiety   . Hypertension   . Depression     Patient Active Problem List   Diagnosis Date Noted  . Body mass index (BMI) of 38.0 to 38.9 in adult 01/26/2016  . Chest pain 08/16/2015  . Adaptation reaction 08/16/2015  . CD (contact dermatitis) 08/16/2015  . Sleep apnea in adult 08/16/2015  . Clinical depression 08/16/2015  . Diabetes mellitus, type 2 (HCC) 08/16/2015  . DD (diverticular disease) 08/16/2015  . Enterogastritis 08/16/2015  . BP (high blood pressure) 08/16/2015  . Adaptive colitis 08/16/2015  . Adiposity 08/16/2015  . Panic attack 08/16/2015  . Breath shortness 08/16/2015  . Apnea, sleep 08/16/2015  . Anxiety 06/14/2015    Past Surgical History  Procedure Laterality Date  . Cesarean section  1999  . Cholecystectomy  05/29/2008    Current Outpatient Rx  Name  Route  Sig  Dispense  Refill  . ALPRAZolam (XANAX) 0.5 MG tablet      Take 1/2 to one twice daily   75 tablet  5   . BYSTOLIC 10 MG tablet      TAKE ONE TABLET BY MOUTH ONCE DAILY   30 tablet   5   . citalopram (CELEXA) 20 MG tablet      TAKE ONE & ONE-HALF TABLETS BY MOUTH ONCE DAILY   45 tablet   5   . levonorgestrel (MIRENA, 52 MG,) 20 MCG/24HR IUD   Intrauterine   by Intrauterine route.         Marland Kitchen lisinopril (PRINIVIL,ZESTRIL) 10 MG tablet   Oral   Take 1 tablet (10 mg total) by mouth daily.   90 tablet   3   . metFORMIN (GLUCOPHAGE) 500 MG tablet   Oral   Take 1 tablet by mouth 2 (two) times daily.           Allergies Levbid  and Metformin and related  Family History  Problem Relation Age of Onset  . Crohn's disease Mother   . Hypertension Father   . Diabetes Father   . Heart disease Father     MI  . Cancer Paternal Grandmother     lung    Social History Social History  Substance Use Topics  . Smoking status: Current Every Day Smoker -- 0.50 packs/day    Types: Cigarettes  . Smokeless tobacco: Never Used     Comment: was a social smoker  . Alcohol Use: Yes     Comment: social    Review of Systems  Constitutional: Negative for fever. Eyes: Negative for visual changes.  ENT: Negative for sore throat. Cardiovascular: Positive for chest pain. Respiratory: Negative for shortness of breath. Gastrointestinal: Negative for abdominal pain, vomiting and diarrhea. Genitourinary: Negative for dysuria. Musculoskeletal: Negative for back pain. Skin: Negative for rash. Neurological: Negative for headaches, focal weakness or numbness.   10-point ROS otherwise negative.  ____________________________________________   PHYSICAL EXAM:  VITAL SIGNS: ED Triage Vitals  Enc Vitals Group     BP 03/12/16 0252 162/107 mmHg     Pulse Rate 03/12/16 0252 92     Resp 03/12/16 0252 20     Temp 03/12/16 0252 98.4 F (36.9 C)     Temp Source 03/12/16 0252 Oral     SpO2 03/12/16 0252 99 %     Weight 03/12/16 0252 247 lb (112.038 kg)     Height 03/12/16 0252 5\' 7"   (1.702 m)     Head Cir --      Peak Flow --      Pain Score 03/12/16 0252 6     Pain Loc --      Pain Edu? --      Excl. in GC? --     Constitutional: Alert and oriented. Well appearing and in no distress. Eyes: Conjunctivae are normal. PERRL. Normal extraocular movements. ENT   Head: Normocephalic and atraumatic.   Nose: No congestion/rhinnorhea.   Mouth/Throat: Mucous membranes are moist.   Neck: No stridor. Hematological/Lymphatic/Immunilogical: No cervical lymphadenopathy. Cardiovascular: Normal rate, regular rhythm. Normal and symmetric distal pulses are present in all extremities. No murmurs, rubs, or gallops. Respiratory: Normal respiratory effort without tachypnea nor retractions. Breath sounds are clear and equal bilaterally. No wheezes/rales/rhonchi. Gastrointestinal: Soft and nontender. No distention. There is no CVA tenderness. Genitourinary: deferred Musculoskeletal: Nontender with normal range of motion in all extremities. No joint effusions.  No lower extremity tenderness nor edema. Neurologic:  Normal speech and language. No gross focal neurologic deficits are appreciated. Speech is normal.  Skin:  Skin is warm, dry and intact. No rash noted. Psychiatric: Mood and affect are normal. Speech and behavior are normal. Patient exhibits appropriate insight and judgment.  ____________________________________________    LABS (pertinent positives/negatives)  Labs Reviewed  BASIC METABOLIC PANEL - Abnormal; Notable for the following:    Sodium 133 (*)    Glucose, Bld 169 (*)    All other components within normal limits  CBC - Abnormal; Notable for the following:    WBC 13.2 (*)    All other components within normal limits  TROPONIN I  FIBRIN DERIVATIVES D-DIMER ([facility] ONLY)  TROPONIN I     ____________________________________________   EKG  ED ECG REPORT I, [place] N BROWN, the attending physician, personally viewed and interpreted this ECG.    Date: 03/12/2016  EKG Time: 4:38AM  Rate: 72  Rhythm:Normal Sinus Rhythm Axis: Normal  Intervals:Normal   ST&T Change: None   ____________________________________________    RADIOLOGY   DG Chest 2 View (Final result) Result time: 03/12/16 03:21:18   Final result by Rad Results In Interface (03/12/16 03:21:18)   Narrative:   CLINICAL DATA: Awoke with chest pain. Elevated blood pressure.  EXAM: CHEST 2 VIEW  COMPARISON: None.  FINDINGS: The cardiomediastinal contours are normal. The lungs are clear. Pulmonary vasculature is normal. No consolidation, pleural effusion, or pneumothorax. No acute osseous abnormalities are seen.  IMPRESSION: No acute pulmonary process.   Electronically Signed By: Rubye OaksMelanie Ehinger M.D. On: 03/12/2016 03:21          INITIAL IMPRESSION / ASSESSMENT AND PLAN / ED  COURSE  Pertinent labs & imaging results that were available during my care of the patient were reviewed by me and considered in my medical decision making (see chart for details).  EKG revealed no evidence of ST segment elevation cardiac enzymes negative 2, d-dimer negative. I discussed with the patient at length the importance of following up with cardiology for outpatient stress and further cardiac evaluation.  ____________________________________________   FINAL CLINICAL IMPRESSION(S) / ED DIAGNOSES  Final diagnoses:  Chest pain, unspecified chest pain type      Darci Current, MD 03/12/16 2145

## 2016-03-12 NOTE — ED Notes (Signed)
Pt calling out reporting intense chest pressure and hot feeling again. No changes noted on monitor. Provider notified.

## 2016-03-12 NOTE — Discharge Instructions (Signed)
Nonspecific Chest Pain  °Chest pain can be caused by many different conditions. There is always a chance that your pain could be related to something serious, such as a heart attack or a blood clot in your lungs. Chest pain can also be caused by conditions that are not life-threatening. If you have chest pain, it is very important to follow up with your health care provider. °CAUSES  °Chest pain can be caused by: °· Heartburn. °· Pneumonia or bronchitis. °· Anxiety or stress. °· Inflammation around your heart (pericarditis) or lung (pleuritis or pleurisy). °· A blood clot in your lung. °· A collapsed lung (pneumothorax). It can develop suddenly on its own (spontaneous pneumothorax) or from trauma to the chest. °· Shingles infection (varicella-zoster virus). °· Heart attack. °· Damage to the bones, muscles, and cartilage that make up your chest wall. This can include: °¨ Bruised bones due to injury. °¨ Strained muscles or cartilage due to frequent or repeated coughing or overwork. °¨ Fracture to one or more ribs. °¨ Sore cartilage due to inflammation (costochondritis). °RISK FACTORS  °Risk factors for chest pain may include: °· Activities that increase your risk for trauma or injury to your chest. °· Respiratory infections or conditions that cause frequent coughing. °· Medical conditions or overeating that can cause heartburn. °· Heart disease or family history of heart disease. °· Conditions or health behaviors that increase your risk of developing a blood clot. °· Having had chicken pox (varicella zoster). °SIGNS AND SYMPTOMS °Chest pain can feel like: °· Burning or tingling on the surface of your chest or deep in your chest. °· Crushing, pressure, aching, or squeezing pain. °· Dull or sharp pain that is worse when you move, cough, or take a deep breath. °· Pain that is also felt in your back, neck, shoulder, or arm, or pain that spreads to any of these areas. °Your chest pain may come and go, or it may stay  constant. °DIAGNOSIS °Lab tests or other studies may be needed to find the cause of your pain. Your health care provider may have you take a test called an ambulatory ECG (electrocardiogram). An ECG records your heartbeat patterns at the time the test is performed. You may also have other tests, such as: °· Transthoracic echocardiogram (TTE). During echocardiography, sound waves are used to create a picture of all of the heart structures and to look at how blood flows through your heart. °· Transesophageal echocardiogram (TEE). This is a more advanced imaging test that obtains images from inside your body. It allows your health care provider to see your heart in finer detail. °· Cardiac monitoring. This allows your health care provider to monitor your heart rate and rhythm in real time. °· Holter monitor. This is a portable device that records your heartbeat and can help to diagnose abnormal heartbeats. It allows your health care provider to track your heart activity for several days, if needed. °· Stress tests. These can be done through exercise or by taking medicine that makes your heart beat more quickly. °· Blood tests. °· Imaging tests. °TREATMENT  °Your treatment depends on what is causing your chest pain. Treatment may include: °· Medicines. These may include: °¨ Acid blockers for heartburn. °¨ Anti-inflammatory medicine. °¨ Pain medicine for inflammatory conditions. °¨ Antibiotic medicine, if an infection is present. °¨ Medicines to dissolve blood clots. °¨ Medicines to treat coronary artery disease. °· Supportive care for conditions that do not require medicines. This may include: °¨ Resting. °¨ Applying heat   or cold packs to injured areas. °¨ Limiting activities until pain decreases. °HOME CARE INSTRUCTIONS °· If you were prescribed an antibiotic medicine, finish it all even if you start to feel better. °· Avoid any activities that bring on chest pain. °· Do not use any tobacco products, including  cigarettes, chewing tobacco, or electronic cigarettes. If you need help quitting, ask your health care provider. °· Do not drink alcohol. °· Take medicines only as directed by your health care provider. °· Keep all follow-up visits as directed by your health care provider. This is important. This includes any further testing if your chest pain does not go away. °· If heartburn is the cause for your chest pain, you may be told to keep your head raised (elevated) while sleeping. This reduces the chance that acid will go from your stomach into your esophagus. °· Make lifestyle changes as directed by your health care provider. These may include: °¨ Getting regular exercise. Ask your health care provider to suggest some activities that are safe for you. °¨ Eating a heart-healthy diet. A registered dietitian can help you to learn healthy eating options. °¨ Maintaining a healthy weight. °¨ Managing diabetes, if necessary. °¨ Reducing stress. °SEEK MEDICAL CARE IF: °· Your chest pain does not go away after treatment. °· You have a rash with blisters on your chest. °· You have a fever. °SEEK IMMEDIATE MEDICAL CARE IF:  °· Your chest pain is worse. °· You have an increasing cough, or you cough up blood. °· You have severe abdominal pain. °· You have severe weakness. °· You faint. °· You have chills. °· You have sudden, unexplained chest discomfort. °· You have sudden, unexplained discomfort in your arms, back, neck, or jaw. °· You have shortness of breath at any time. °· You suddenly start to sweat, or your skin gets clammy. °· You feel nauseous or you vomit. °· You suddenly feel light-headed or dizzy. °· Your heart begins to beat quickly, or it feels like it is skipping beats. °These symptoms may represent a serious problem that is an emergency. Do not wait to see if the symptoms will go away. Get medical help right away. Call your local emergency services (911 in the [place] Do not drive yourself to the hospital. °  °This  information is not intended to replace advice given to you by your health care provider. Make sure you discuss any questions you have with your health care provider. °  °Document Released: 08/20/2005 Document Revised: 12/01/2014 Document Reviewed: 06/16/2014 °Elsevier Interactive Patient Education ©2016 [employer] ° °

## 2016-03-12 NOTE — ED Notes (Signed)
Pt sleepy from pain medication, O2 sats dropping into 80s. Pt placed on 2L of oxygen by [place].

## 2016-03-12 NOTE — ED Notes (Signed)
Pt called out stating that she was having an episode of chest pressure like she had had at home. Reports that she was pretty much asleep when she had sudden onset of intense chest pressure and diaphoresis that was 9/10 on pain scale that lasted for 4-5 minutes before subsiding. No apparent changes on monitor. 12 lead EKG obtained. Provider notified and orders received. Will continue to monitor.

## 2016-03-12 NOTE — ED Notes (Signed)
Pt arrived via EMS from home. Woke up approximately an hour ago c/o mid-chest pressure with associated nausea, no vomiting, some SOB. Pt hypertensive with EMS, EMS got BP of 210/120, after 2 SL sprays of nitro, BP 176/103. Pt has hx of HTN, did take medications last night. EMS also gave pt 324 of ASA.

## 2016-03-12 NOTE — ED Notes (Signed)
Pt reports that she had an episode of intense chest pressure last 1-2 minutes, then it subsided.

## 2016-03-12 NOTE — ED Notes (Signed)
Pt a/o. 0/10 chest pain or pressure at this time. BP 112/55 with 650/1000 ml ns infused. Left fa 20ga iv infusing w/o s/s of infection or infiltration. Pt denies any needs or wants at this time.

## 2016-03-17 ENCOUNTER — Other Ambulatory Visit: Payer: Self-pay | Admitting: Family Medicine

## 2016-03-17 DIAGNOSIS — F419 Anxiety disorder, unspecified: Secondary | ICD-10-CM

## 2016-03-18 ENCOUNTER — Other Ambulatory Visit: Payer: Self-pay | Admitting: Family Medicine

## 2016-03-18 NOTE — Telephone Encounter (Signed)
Printed, please fax or call in to pharmacy. Thank you.   

## 2016-04-02 ENCOUNTER — Ambulatory Visit: Payer: BLUE CROSS/BLUE SHIELD | Admitting: Family Medicine

## 2016-04-16 ENCOUNTER — Ambulatory Visit: Payer: BLUE CROSS/BLUE SHIELD | Admitting: Family Medicine

## 2016-05-01 ENCOUNTER — Other Ambulatory Visit: Payer: Self-pay | Admitting: Family Medicine

## 2016-05-01 DIAGNOSIS — I10 Essential (primary) hypertension: Secondary | ICD-10-CM

## 2016-12-11 ENCOUNTER — Other Ambulatory Visit: Payer: Self-pay | Admitting: Family Medicine

## 2016-12-11 DIAGNOSIS — F419 Anxiety disorder, unspecified: Secondary | ICD-10-CM

## 2018-10-15 ENCOUNTER — Encounter (HOSPITAL_BASED_OUTPATIENT_CLINIC_OR_DEPARTMENT_OTHER): Payer: Self-pay

## 2018-10-15 ENCOUNTER — Ambulatory Visit (HOSPITAL_BASED_OUTPATIENT_CLINIC_OR_DEPARTMENT_OTHER): Admit: 2018-10-15 | Payer: BLUE CROSS/BLUE SHIELD | Admitting: Obstetrics and Gynecology

## 2018-10-15 SURGERY — DILATATION AND CURETTAGE /HYSTEROSCOPY
Anesthesia: Choice

## 2018-11-09 ENCOUNTER — Telehealth: Payer: Self-pay | Admitting: Family Medicine

## 2018-11-15 ENCOUNTER — Encounter (HOSPITAL_BASED_OUTPATIENT_CLINIC_OR_DEPARTMENT_OTHER): Payer: Self-pay

## 2018-11-18 ENCOUNTER — Other Ambulatory Visit: Payer: Self-pay | Admitting: Obstetrics and Gynecology

## 2018-11-25 ENCOUNTER — Other Ambulatory Visit: Payer: Self-pay

## 2018-11-25 ENCOUNTER — Encounter (HOSPITAL_BASED_OUTPATIENT_CLINIC_OR_DEPARTMENT_OTHER): Payer: Self-pay | Admitting: *Deleted

## 2018-11-25 NOTE — Progress Notes (Addendum)
Spoke with Katherine Suarez by phone. Npo after midnight food, clear liquids until 900 am, then npo. Arrive 1000 am 12-02-18 [facility]. No meds to take, bring cpap mask tubing and machine Patient cannot come and pick up drink or have lab appointment. Patient will call back Monday 11-29-2018 if daughter is available to come pick up presurgery drink. ekg with tracing and holter monitor report requested via fax from [facility]. Needs type and screen and istat 4 and urine poct. And ekg if not received from [facility]. Driver mother jeanette.

## 2018-12-02 ENCOUNTER — Ambulatory Visit (HOSPITAL_BASED_OUTPATIENT_CLINIC_OR_DEPARTMENT_OTHER): Admit: 2018-12-02 | Payer: BLUE CROSS/BLUE SHIELD | Admitting: Obstetrics and Gynecology

## 2018-12-02 HISTORY — DX: Syncope and collapse: R55

## 2018-12-02 HISTORY — DX: Sleep apnea, unspecified: G47.30

## 2018-12-02 HISTORY — DX: Obesity, unspecified: E66.9

## 2018-12-02 HISTORY — DX: Diverticulosis of intestine, part unspecified, without perforation or abscess without bleeding: K57.90

## 2018-12-02 SURGERY — DILATATION AND CURETTAGE /HYSTEROSCOPY
Anesthesia: Choice

## 2019-10-07 ENCOUNTER — Other Ambulatory Visit: Payer: Self-pay | Admitting: Obstetrics and Gynecology

## 2019-10-11 ENCOUNTER — Encounter (HOSPITAL_COMMUNITY)
Admission: RE | Admit: 2019-10-11 | Discharge: 2019-10-11 | Disposition: A | Discharge status: Home / Self Care | Payer: BC Managed Care – PPO | Source: Ambulatory Visit | Attending: Obstetrics and Gynecology | Admitting: Obstetrics and Gynecology

## 2019-10-11 ENCOUNTER — Other Ambulatory Visit (HOSPITAL_COMMUNITY)
Admission: RE | Admit: 2019-10-11 | Discharge: 2019-10-11 | Disposition: A | Discharge status: Home / Self Care | Payer: BC Managed Care – PPO | Source: Ambulatory Visit | Attending: Obstetrics and Gynecology | Admitting: Obstetrics and Gynecology

## 2019-10-11 ENCOUNTER — Other Ambulatory Visit: Payer: Self-pay

## 2019-10-11 DIAGNOSIS — F419 Anxiety disorder, unspecified: Secondary | ICD-10-CM | POA: Diagnosis not present

## 2019-10-11 DIAGNOSIS — I10 Essential (primary) hypertension: Secondary | ICD-10-CM | POA: Diagnosis not present

## 2019-10-11 DIAGNOSIS — Z20828 Contact with and (suspected) exposure to other viral communicable diseases: Secondary | ICD-10-CM | POA: Diagnosis not present

## 2019-10-11 DIAGNOSIS — N938 Other specified abnormal uterine and vaginal bleeding: Secondary | ICD-10-CM | POA: Diagnosis present

## 2019-10-11 DIAGNOSIS — Z793 Long term (current) use of hormonal contraceptives: Secondary | ICD-10-CM | POA: Diagnosis not present

## 2019-10-11 DIAGNOSIS — Z01812 Encounter for preprocedural laboratory examination: Secondary | ICD-10-CM | POA: Insufficient documentation

## 2019-10-11 DIAGNOSIS — Z87891 Personal history of nicotine dependence: Secondary | ICD-10-CM | POA: Diagnosis not present

## 2019-10-11 DIAGNOSIS — Z888 Allergy status to other drugs, medicaments and biological substances status: Secondary | ICD-10-CM | POA: Diagnosis not present

## 2019-10-11 DIAGNOSIS — K219 Gastro-esophageal reflux disease without esophagitis: Secondary | ICD-10-CM | POA: Diagnosis not present

## 2019-10-11 DIAGNOSIS — G4733 Obstructive sleep apnea (adult) (pediatric): Secondary | ICD-10-CM | POA: Diagnosis not present

## 2019-10-11 DIAGNOSIS — Z8249 Family history of ischemic heart disease and other diseases of the circulatory system: Secondary | ICD-10-CM | POA: Diagnosis not present

## 2019-10-11 DIAGNOSIS — F329 Major depressive disorder, single episode, unspecified: Secondary | ICD-10-CM | POA: Diagnosis not present

## 2019-10-11 DIAGNOSIS — Z79899 Other long term (current) drug therapy: Secondary | ICD-10-CM | POA: Diagnosis not present

## 2019-10-11 DIAGNOSIS — E1165 Type 2 diabetes mellitus with hyperglycemia: Secondary | ICD-10-CM | POA: Diagnosis not present

## 2019-10-11 DIAGNOSIS — Z833 Family history of diabetes mellitus: Secondary | ICD-10-CM | POA: Diagnosis not present

## 2019-10-11 LAB — BASIC METABOLIC PANEL
Anion gap: 14 (ref 5–15)
BUN: 12 mg/dL (ref 6–20)
CO2: 23 mmol/L (ref 22–32)
Calcium: 9.3 mg/dL (ref 8.9–10.3)
Chloride: 97 mmol/L — ABNORMAL LOW (ref 98–111)
Creatinine, Ser: 0.84 mg/dL (ref 0.44–1.00)
GFR calc Af Amer: >60 mL/min (ref >60)
GFR calc non Af Amer: >60 mL/min (ref >60)
Glucose, Bld: 417 mg/dL — ABNORMAL HIGH (ref 70–99)
Potassium: 4.1 mmol/L (ref 3.5–5.1)
Sodium: 134 mmol/L — ABNORMAL LOW (ref 135–145)

## 2019-10-11 LAB — CBC
HCT: 43.7 % (ref 36.0–46.0)
Hemoglobin: 14.3 g/dL (ref 12.0–15.0)
MCH: 30.1 pg (ref 26.0–34.0)
MCHC: 32.7 g/dL (ref 30.0–36.0)
MCV: 92 fL (ref 80.0–100.0)
Platelets: 356 10*3/uL (ref 150–400)
RBC: 4.75 MIL/uL (ref 3.87–5.11)
RDW: 12.6 % (ref 11.5–15.5)
WBC: 12.5 10*3/uL — ABNORMAL HIGH (ref 4.0–10.5)
nRBC: 0 % (ref 0.0–0.2)

## 2019-10-11 LAB — ABO/RH: ABO/RH(D): O POS

## 2019-10-11 LAB — SARS CORONAVIRUS 2 (TAT 6-24 HRS): SARS Coronavirus 2: NEGATIVE

## 2019-10-12 ENCOUNTER — Other Ambulatory Visit: Payer: Self-pay

## 2019-10-12 ENCOUNTER — Encounter (HOSPITAL_BASED_OUTPATIENT_CLINIC_OR_DEPARTMENT_OTHER): Payer: Self-pay | Admitting: *Deleted

## 2019-10-12 NOTE — Progress Notes (Signed)
Spoke w/ via phone for pre-op interview--- PT Lab needs dos----   Urine preg            Lab results------ CBC, BMP, T&S, EKG done 10-11-2019 in chart/ epic COVID test ------ 10-11-2019 Arrive at -------  1030 NPO after ------  MN Medications to take morning of surgery ----- NONE Diabetic medication ----- does not take any diabetic medication Patient Special Instructions -----  Informed pt that her glucose was 417 (BMP results).  Pt verbalized that if her blood sugar is high dos she would be cancelled by anesthesia.  Per pt has take medication in past year and does not check sugar at home Pre-Op special Istructions ----- n/a Patient verbalized understanding of instructions that were given at this phone interview. Patient denies shortness of breath, chest pain, fever, cough a this phone interview.  Anesthesia Review:  PCP: Cardiologist : Chest x-ray : EKG : Echo : Cardiac Cath :  Sleep Study/ CPAP : Fasting Blood Sugar :      / Checks Blood Sugar -- times a day:   Blood Thinner/ Instructions /Last Dose: ASA / Instructions/ Last Dose :   Patient denies shortness of breath, chest pain, fever, and cough at this phone interview.

## 2019-10-13 ENCOUNTER — Ambulatory Visit (HOSPITAL_BASED_OUTPATIENT_CLINIC_OR_DEPARTMENT_OTHER): Payer: BC Managed Care – PPO | Admitting: Anesthesiology

## 2019-10-13 ENCOUNTER — Encounter (HOSPITAL_BASED_OUTPATIENT_CLINIC_OR_DEPARTMENT_OTHER)
Admission: RE | Disposition: A | Discharge status: Home / Self Care | Payer: Self-pay | Source: Home / Self Care | Attending: Obstetrics and Gynecology

## 2019-10-13 ENCOUNTER — Encounter (HOSPITAL_BASED_OUTPATIENT_CLINIC_OR_DEPARTMENT_OTHER): Payer: Self-pay

## 2019-10-13 ENCOUNTER — Ambulatory Visit (HOSPITAL_BASED_OUTPATIENT_CLINIC_OR_DEPARTMENT_OTHER)
Admission: RE | Admit: 2019-10-13 | Discharge: 2019-10-13 | Disposition: A | Discharge status: Home / Self Care | Payer: BC Managed Care – PPO | Attending: Obstetrics and Gynecology | Admitting: Obstetrics and Gynecology

## 2019-10-13 DIAGNOSIS — F419 Anxiety disorder, unspecified: Secondary | ICD-10-CM | POA: Insufficient documentation

## 2019-10-13 DIAGNOSIS — Z79899 Other long term (current) drug therapy: Secondary | ICD-10-CM | POA: Insufficient documentation

## 2019-10-13 DIAGNOSIS — F329 Major depressive disorder, single episode, unspecified: Secondary | ICD-10-CM | POA: Insufficient documentation

## 2019-10-13 DIAGNOSIS — Z87891 Personal history of nicotine dependence: Secondary | ICD-10-CM | POA: Insufficient documentation

## 2019-10-13 DIAGNOSIS — N938 Other specified abnormal uterine and vaginal bleeding: Secondary | ICD-10-CM | POA: Diagnosis not present

## 2019-10-13 DIAGNOSIS — Z793 Long term (current) use of hormonal contraceptives: Secondary | ICD-10-CM | POA: Insufficient documentation

## 2019-10-13 DIAGNOSIS — K219 Gastro-esophageal reflux disease without esophagitis: Secondary | ICD-10-CM | POA: Insufficient documentation

## 2019-10-13 DIAGNOSIS — Z833 Family history of diabetes mellitus: Secondary | ICD-10-CM | POA: Insufficient documentation

## 2019-10-13 DIAGNOSIS — E1165 Type 2 diabetes mellitus with hyperglycemia: Secondary | ICD-10-CM | POA: Insufficient documentation

## 2019-10-13 DIAGNOSIS — G4733 Obstructive sleep apnea (adult) (pediatric): Secondary | ICD-10-CM | POA: Insufficient documentation

## 2019-10-13 DIAGNOSIS — Z8249 Family history of ischemic heart disease and other diseases of the circulatory system: Secondary | ICD-10-CM | POA: Insufficient documentation

## 2019-10-13 DIAGNOSIS — Z888 Allergy status to other drugs, medicaments and biological substances status: Secondary | ICD-10-CM | POA: Insufficient documentation

## 2019-10-13 DIAGNOSIS — I10 Essential (primary) hypertension: Secondary | ICD-10-CM | POA: Insufficient documentation

## 2019-10-13 HISTORY — DX: Type 2 diabetes mellitus without complications: E11.9

## 2019-10-13 HISTORY — DX: Personal history of other specified conditions: Z87.898

## 2019-10-13 HISTORY — DX: Gastro-esophageal reflux disease without esophagitis: K21.9

## 2019-10-13 HISTORY — PX: HYSTEROSCOPY WITH D & C: SHX1775

## 2019-10-13 HISTORY — DX: Abnormal uterine and vaginal bleeding, unspecified: N93.9

## 2019-10-13 HISTORY — PX: HYSTEROSCOPY W/D&C: SHX1775

## 2019-10-13 HISTORY — PX: CERVICAL POLYPECTOMY: SHX88

## 2019-10-13 HISTORY — DX: Obstructive sleep apnea (adult) (pediatric): G47.33

## 2019-10-13 LAB — TYPE AND SCREEN
ABO/RH(D): O POS
Antibody Screen: NEGATIVE

## 2019-10-13 LAB — GLUCOSE, CAPILLARY
Glucose-Capillary: 296 mg/dL — ABNORMAL HIGH (ref 70–99)
Glucose-Capillary: 209 mg/dL — ABNORMAL HIGH (ref 70–99)

## 2019-10-13 LAB — POCT PREGNANCY, URINE: Preg Test, Ur: NEGATIVE

## 2019-10-13 SURGERY — DILATATION AND CURETTAGE /HYSTEROSCOPY
Anesthesia: General | Site: Vagina

## 2019-10-13 MED ORDER — OXYCODONE HCL 5 MG/5ML PO SOLN
5.0000 mg | Freq: Once | ORAL | Status: DC | PRN
  Filled 2019-10-13: qty 5

## 2019-10-13 MED ORDER — ACETAMINOPHEN 500 MG PO TABS
1000.0000 mg | ORAL_TABLET | Freq: Once | ORAL | Status: AC
  Administered 2019-10-13: 1000 mg via ORAL
  Filled 2019-10-13: qty 2

## 2019-10-13 MED ORDER — ONDANSETRON HCL 4 MG/2ML IJ SOLN
INTRAMUSCULAR | Status: DC | PRN
  Administered 2019-10-13: 4 mg via INTRAVENOUS

## 2019-10-13 MED ORDER — LIDOCAINE 2% (20 MG/ML) 5 ML SYRINGE
INTRAMUSCULAR | Status: DC | PRN
  Administered 2019-10-13: 100 mg via INTRAVENOUS

## 2019-10-13 MED ORDER — KETOROLAC TROMETHAMINE 30 MG/ML IJ SOLN
INTRAMUSCULAR | Status: DC | PRN
  Administered 2019-10-13: 30 mg via INTRAVENOUS

## 2019-10-13 MED ORDER — PROPOFOL 10 MG/ML IV BOLUS
INTRAVENOUS | Status: AC
  Filled 2019-10-13: qty 20

## 2019-10-13 MED ORDER — OXYCODONE HCL 5 MG PO TABS
5.0000 mg | ORAL_TABLET | Freq: Once | ORAL | Status: DC | PRN
  Filled 2019-10-13: qty 1

## 2019-10-13 MED ORDER — KETOROLAC TROMETHAMINE 30 MG/ML IJ SOLN
30.0000 mg | Freq: Once | INTRAMUSCULAR | Status: DC | PRN
  Filled 2019-10-13: qty 1

## 2019-10-13 MED ORDER — SODIUM CHLORIDE 0.9 % IR SOLN
Status: DC | PRN
  Administered 2019-10-13: 3000 mL

## 2019-10-13 MED ORDER — LIDOCAINE HCL 1 % IJ SOLN
INTRAMUSCULAR | Status: DC | PRN
  Administered 2019-10-13: 10 mL

## 2019-10-13 MED ORDER — FENTANYL CITRATE (PF) 100 MCG/2ML IJ SOLN
25.0000 ug | INTRAMUSCULAR | Status: DC | PRN
  Filled 2019-10-13: qty 1

## 2019-10-13 MED ORDER — LIDOCAINE HCL 1 % IJ SOLN
INTRAMUSCULAR | Status: AC
  Filled 2019-10-13: qty 20

## 2019-10-13 MED ORDER — DEXAMETHASONE SODIUM PHOSPHATE 10 MG/ML IJ SOLN
INTRAMUSCULAR | Status: DC | PRN
  Administered 2019-10-13: 5 mg via INTRAVENOUS

## 2019-10-13 MED ORDER — PROPOFOL 10 MG/ML IV BOLUS
INTRAVENOUS | Status: DC | PRN
  Administered 2019-10-13: 200 mg via INTRAVENOUS

## 2019-10-13 MED ORDER — KETOROLAC TROMETHAMINE 30 MG/ML IJ SOLN
INTRAMUSCULAR | Status: AC
  Filled 2019-10-13: qty 1

## 2019-10-13 MED ORDER — LIDOCAINE 2% (20 MG/ML) 5 ML SYRINGE
INTRAMUSCULAR | Status: AC
  Filled 2019-10-13: qty 5

## 2019-10-13 MED ORDER — PHENYLEPHRINE 40 MCG/ML (10ML) SYRINGE FOR IV PUSH (FOR BLOOD PRESSURE SUPPORT)
PREFILLED_SYRINGE | INTRAVENOUS | Status: AC
  Filled 2019-10-13: qty 10

## 2019-10-13 MED ORDER — IBUPROFEN 600 MG PO TABS: 600.0000 mg | ORAL_TABLET | Freq: Four times a day (QID) | ORAL | 0 refills | Status: DC | PRN

## 2019-10-13 MED ORDER — PROMETHAZINE HCL 25 MG/ML IJ SOLN
6.2500 mg | INTRAMUSCULAR | Status: DC | PRN
  Filled 2019-10-13: qty 1

## 2019-10-13 MED ORDER — LACTATED RINGERS IV SOLN
INTRAVENOUS | Status: DC
  Filled 2019-10-13: qty 1000

## 2019-10-13 MED ORDER — MIDAZOLAM HCL 2 MG/2ML IJ SOLN
INTRAMUSCULAR | Status: AC
  Filled 2019-10-13: qty 2

## 2019-10-13 MED ORDER — FENTANYL CITRATE (PF) 100 MCG/2ML IJ SOLN
INTRAMUSCULAR | Status: DC | PRN
  Administered 2019-10-13 (×2): 50 ug via INTRAVENOUS

## 2019-10-13 MED ORDER — MIDAZOLAM HCL 5 MG/5ML IJ SOLN
INTRAMUSCULAR | Status: DC | PRN
  Administered 2019-10-13: 2 mg via INTRAVENOUS

## 2019-10-13 MED ORDER — ONDANSETRON HCL 4 MG/2ML IJ SOLN
INTRAMUSCULAR | Status: AC
  Filled 2019-10-13: qty 2

## 2019-10-13 MED ORDER — PHENYLEPHRINE HCL (PRESSORS) 10 MG/ML IV SOLN
INTRAVENOUS | Status: DC | PRN
  Administered 2019-10-13: 120 ug via INTRAVENOUS
  Administered 2019-10-13: 80 ug via INTRAVENOUS

## 2019-10-13 MED ORDER — LACTATED RINGERS IV SOLN
INTRAVENOUS | Status: DC
  Administered 2019-10-13: 11:00:00 via INTRAVENOUS
  Filled 2019-10-13: qty 1000

## 2019-10-13 MED ORDER — ACETAMINOPHEN 500 MG PO TABS
ORAL_TABLET | ORAL | Status: AC
  Filled 2019-10-13: qty 2

## 2019-10-13 MED ORDER — DEXAMETHASONE SODIUM PHOSPHATE 10 MG/ML IJ SOLN
INTRAMUSCULAR | Status: AC
  Filled 2019-10-13: qty 1

## 2019-10-13 MED ORDER — FENTANYL CITRATE (PF) 100 MCG/2ML IJ SOLN
INTRAMUSCULAR | Status: AC
  Filled 2019-10-13: qty 2

## 2019-10-13 SURGICAL SUPPLY — 18 items
BIPOLAR CUTTING LOOP 21FR (ELECTRODE)
CANISTER SUCT 3000ML PPV (MISCELLANEOUS) ×4 IMPLANT
CATH ROBINSON RED A/P 16FR (CATHETERS) ×4 IMPLANT
COVER WAND RF STERILE (DRAPES) ×4 IMPLANT
DILATOR CANAL MILEX (MISCELLANEOUS) IMPLANT
DRSG TELFA 3X8 NADH (GAUZE/BANDAGES/DRESSINGS) ×4 IMPLANT
GLOVE BIO SURGEON STRL SZ7 (GLOVE) ×4 IMPLANT
GOWN STRL REUS W/TWL LRG LVL3 (GOWN DISPOSABLE) ×4 IMPLANT
IV NS IRRIG 3000ML ARTHROMATIC (IV SOLUTION) ×4 IMPLANT
KIT PROCEDURE FLUENT (KITS) ×4 IMPLANT
KIT TURNOVER CYSTO (KITS) ×4 IMPLANT
LOOP CUTTING BIPOLAR 21FR (ELECTRODE) IMPLANT
PACK VAGINAL MINOR WOMEN LF (CUSTOM PROCEDURE TRAY) ×4 IMPLANT
PAD DRESSING TELFA 3X8 NADH (GAUZE/BANDAGES/DRESSINGS) IMPLANT
PAD OB MATERNITY 4.3X12.25 (PERSONAL CARE ITEMS) ×4 IMPLANT
PAD PREP 24X48 CUFFED NSTRL (MISCELLANEOUS) ×4 IMPLANT
TOWEL OR 17X26 10 PK STRL BLUE (TOWEL DISPOSABLE) ×8 IMPLANT
WATER STERILE IRR 500ML POUR (IV SOLUTION) ×4 IMPLANT

## 2019-10-13 NOTE — H&P (Signed)
Katherine Suarez is an 48 y.o. female.   48 yo presents for surgical management of abnormal uterine bleeding. She has a mirena iud in place. Ove rthe past 1.5 years she's had persistent irregular bleeding with the iud. An endometrial bx was performed 08/2018 that was benign. She was scheduled for hysteroscopy D&C in Jan 2020 but cancelled due to financial reasons. Throughout 2020 she has had persistent bleeding. A repeat endo bx was performed last montha  Was also benign. She presents for hysteroscopy D&C with possible removal and reinsertion fot eh IUD. R/B/A reviewed and she wishes to proceed  She has poorly controlled diabetes mellitus  Menstrual History:  No LMP recorded. (Menstrual status: IUD).    Past Medical History:  Diagnosis Date  . Abnormal uterine bleeding (AUB)   . Anxiety   . Depression   . Diverticulosis 11/05/2009   Noted on colonoscopy  . GERD (gastroesophageal reflux disease)   . History of syncope    12/ 2019 overnight stay @ [facility],  (record in care everywhere) normal EKG, normal tropnin, telemetry monitor no no sig. arrhymthia   . Hypertension    10-12-2019  per pt has had a ETT and nuclear stress test approx. 2017 and 2018 was told normal  . OSA (obstructive sleep apnea)    per pt moderate , cpap intolerant  . Type 2 diabetes mellitus ([place])    10-12-2019 per pt followed by pcp , Dr Randalyn Rhea @ [facility] in [place]),  per pt had taken any medication for past year and pt states does not do cbg checks    Past Surgical History:  Procedure Laterality Date  . CESAREAN SECTION  1999  . COLONOSCOPY  last one 2017 approx.  . ESOPHAGOGASTRODUODENOSCOPY  03/ 2019   care everywhere  . LAPAROSCOPIC CHOLECYSTECTOMY  05/29/2008    Family History  Problem Relation Age of Onset  . Crohn's disease Mother   . Hypertension Father   . Diabetes Father   . Heart disease Father        MI  . Cancer Paternal Grandmother        lung    Social History:  reports  that she quit smoking about 5 years ago. Her smoking use included cigarettes. She smoked 0.50 packs per day. She has never used smokeless tobacco. She reports previous alcohol use. She reports that she does not use drugs.  Allergies:  Allergies  Allergen Reactions  . Levbid [Hyoscyamine]     Not sure reaction  . Metformin And Related     Diarrhea, nausea, and "weird feeling"    Medications Prior to Admission  Medication Sig Dispense Refill Last Dose  . calcium carbonate (TUMS - DOSED IN MG ELEMENTAL CALCIUM) 500 MG chewable tablet Chew 1 tablet by mouth as needed for indigestion or heartburn.   Past Week at Unknown time  . citalopram (CELEXA) 40 MG tablet Take 40 mg by mouth at bedtime.   10/12/2019 at Unknown time  . levonorgestrel (MIRENA, 52 MG,) 20 MCG/24HR IUD by Intrauterine route. Inserted 2017   10/13/2019 at Unknown time  . lisinopril (ZESTRIL) 10 MG tablet Take 10 mg by mouth at bedtime.   10/12/2019 at Unknown time    ROS  Blood pressure (!) 147/87, pulse 98, temperature 97.9 F (36.6 C), temperature source Oral, resp. rate 18, height 5\' 7"  (1.702 m), weight 104.9 kg, SpO2 98 %. Physical Exam   AOX3, NAD Normal work of breathing Abdomen soft  Results for orders  placed or performed during the hospital encounter of 10/13/19 (from the past 24 hour(s))  Pregnancy, urine POC     Status: None   Collection Time: 10/13/19 10:56 AM  Result Value Ref Range   Preg Test, Ur NEGATIVE NEGATIVE  Glucose, capillary     Status: Abnormal   Collection Time: 10/13/19 11:20 AM  Result Value Ref Range   Glucose-Capillary 296 (H) 70 - 99 mg/dL    No results found.  Assessment/Plan: 1) Admit 2) Proceed with hysteroscopy, D&C, possible removal and reinsertion of mirena IUD.  3) SCDs to OR  Waynard Reeds 10/13/2019, 12:10 PM

## 2019-10-13 NOTE — Anesthesia Preprocedure Evaluation (Addendum)
Anesthesia Evaluation  Patient identified by MRN, date of birth, ID band Patient awake    Reviewed: Allergy & Precautions, NPO status , Patient's Chart, lab work & pertinent test results  History of Anesthesia Complications Negative for: history of anesthetic complications  Airway Mallampati: III  TM Distance: >3 FB Neck ROM: Full    Dental no notable dental hx. (+) Teeth Intact, Dental Advisory Given   Pulmonary sleep apnea , former smoker,    Pulmonary exam normal        Cardiovascular hypertension, Pt. on medications Normal cardiovascular exam     Neuro/Psych Anxiety Depression negative neurological ROS     GI/Hepatic Neg liver ROS, GERD  Controlled and Medicated,  Endo/Other  diabetes, Type 2  Renal/GU negative Renal ROS  negative genitourinary   Musculoskeletal negative musculoskeletal ROS (+)   Abdominal   Peds  Hematology negative hematology ROS (+)   Anesthesia Other Findings Day of surgery medications reviewed with patient.  Reproductive/Obstetrics negative OB ROS                           Anesthesia Physical Anesthesia Plan  ASA: II  Anesthesia Plan: General   Post-op Pain Management:    Induction: Intravenous  PONV Risk Score and Plan: 3 and Treatment may vary due to age or medical condition, Ondansetron, Dexamethasone and Midazolam  Airway Management Planned: LMA  Additional Equipment: None  Intra-op Plan:   Post-operative Plan: Extubation in OR  Informed Consent: I have reviewed the patients History and Physical, chart, labs and discussed the procedure including the risks, benefits and alternatives for the proposed anesthesia with the patient or authorized representative who has indicated his/her understanding and acceptance.     Dental advisory given  Plan Discussed with: CRNA and Anesthesiologist  Anesthesia Plan Comments:        Anesthesia Quick  Evaluation

## 2019-10-13 NOTE — Discharge Instructions (Signed)
°  Post Anesthesia Home Care Instructions  Activity: Get plenty of rest for the remainder of the day. A responsible individual must stay with you for 24 hours following the procedure.  For the next 24 hours, DO NOT: -Drive a car -Paediatric nurse -Drink alcoholic beverages -Take any medication unless instructed by your physician -Make any legal decisions or sign important papers.  Meals: Start with liquid foods such as gelatin or soup. Progress to regular foods as tolerated. Avoid greasy, spicy, heavy foods. If nausea and/or vomiting occur, drink only clear liquids until the nausea and/or vomiting subsides. Call your physician if vomiting continues.  Special Instructions/Symptoms: Your throat may feel dry or sore from the anesthesia or the breathing tube placed in your throat during surgery. If this causes discomfort, gargle with warm salt water. The discomfort should disappear within 24 hours.  DISCHARGE INSTRUCTIONS: HYSTEROSCOPY / ENDOMETRIAL ABLATION The following instructions have been prepared to help you care for yourself upon your return home.  May Remove Scop patch on or before  May take Ibuprofen after  May take stool softner while taking narcotic pain medication to prevent constipation.  Drink plenty of water.  Personal hygiene:  Use sanitary pads for vaginal drainage, not tampons.  Shower the day after your procedure.  NO tub baths, pools or Jacuzzis for 2-3 weeks.  Wipe front to back after using the bathroom.  Activity and limitations:  Do NOT drive or operate any equipment for 24 hours. The effects of anesthesia are still present and drowsiness may result.  Do NOT rest in bed all day.  Walking is encouraged.  Walk up and down stairs slowly.  You may resume your normal activity in one to two days or as indicated by your physician. Sexual activity: NO intercourse for at least 2 weeks after the procedure, or as indicated by your Doctor.  Diet: Eat a light  meal as desired this evening. You may resume your usual diet tomorrow.  Return to Work: You may resume your work activities in one to two days or as indicated by Marine scientist.  What to expect after your surgery: Expect to have vaginal bleeding/discharge for 2-3 days and spotting for up to 10 days. It is not unusual to have soreness for up to 1-2 weeks. You may have a slight burning sensation when you urinate for the first day. Mild cramps may continue for a couple of days. You may have a regular period in 2-6 weeks.  Call your doctor for any of the following:  Excessive vaginal bleeding or clotting, saturating and changing one pad every hour.  Inability to urinate 6 hours after discharge from hospital.  Pain not relieved by pain medication.  Fever of 100.4 F or greater.  Unusual vaginal discharge or odor.

## 2019-10-13 NOTE — Transfer of Care (Signed)
Immediate Anesthesia Transfer of Care Note  Patient: Katherine Suarez  Procedure(s) Performed: DILATATION AND CURETTAGE /HYSTEROSCOPY (N/A Vagina ) CERVICAL POLYPECTOMY (N/A )  Patient Location: PACU  Anesthesia Type:General  Level of Consciousness: awake, alert , oriented and patient cooperative  Airway & Oxygen Therapy: Patient Spontanous Breathing and Patient connected to nasal cannula oxygen  Post-op Assessment: Report given to RN and Post -op Vital signs reviewed and stable  Post vital signs: Reviewed and stable  Last Vitals:  Vitals Value Taken Time  BP    Temp    Pulse    Resp    SpO2      Last Pain:  Vitals:   10/13/19 1107  TempSrc: Oral  PainSc: 0-No pain      Patients Stated Pain Goal: 7 (62/[id number])  Complications: No apparent anesthesia complications

## 2019-10-13 NOTE — Anesthesia Postprocedure Evaluation (Signed)
Anesthesia Post Note  Patient: Katherine Suarez  Procedure(s) Performed: DILATATION AND CURETTAGE /HYSTEROSCOPY (N/A Vagina ) CERVICAL POLYPECTOMY (N/A )     Patient location during evaluation: PACU Anesthesia Type: General Level of consciousness: awake and alert and oriented Pain management: pain level controlled Vital Signs Assessment: post-procedure vital signs reviewed and stable Respiratory status: spontaneous breathing, nonlabored ventilation and respiratory function stable Cardiovascular status: blood pressure returned to baseline Postop Assessment: no apparent nausea or vomiting Anesthetic complications: no    Last Vitals:  Vitals:   10/13/19 1345 10/13/19 1400  BP: (!) 129/93 118/76  Pulse: 100 89  Resp: 15 12  Temp:    SpO2: 96% 94%    Last Pain:  Vitals:   10/13/19 1400  TempSrc:   PainSc: 0-No pain                 Brennan Bailey

## 2019-10-13 NOTE — Op Note (Signed)
Pre-Operative Diagnosis: 1) Abnormal uterine bleeding  Postoperative Diagnosis: 1) Abnormal uterine bleeding Procedure: Hysteroscopy, dilation and curettage, IUD removal and reinsertion Surgeon: Dr. Vanessa Kick Assistant: None Operative Findings: Endometrial cavity with irregular thickening. IUD in situ, bilateral tubal ostia visualized. After removal of IUD and initial curettage a false passage was noted inferior to the endometrial cavity. The correct anatomic endometrial cavity was identified. THe anatomic endometrial cavity was noted to be wide at the fundus. Following complete curettage the replaced iud was noted to be at the fundus in the appropriate location Specimen: endometrial curettings EBL: minimal  Katherine Suarez a 48 year old female who presents for definitive surgical management for Abnormal uterine bleeding. Please see the patient's history and physical for complete details of the history. Management options were discussed with the patient. R/B/A reviewed. Following appropriate informed consent was taken to the operating room. The patient was appropriately identified during a time out procedure. LMA general anesthesia was administered and the patient was placed in the dorsal lithotomy position. The patient was prepped and draped in the normal sterile fashion. A speculum was placed into the vagina, a single-tooth tenaculum was placed on the anterior lip of the cervix, and 10 cc of 1% lidocaine was administered in a paracervical fashion. The cervix was serially dilated with Hank dilators. The hysteroscope was initially introduced to get a global visualization of the endometrial cavity. THe IUD was noted to be within the endometrial cavity at the fundus but the tubal ostia could not be identified due to the presence of fluffy endometrium. THe hysteroscope was removed and the IUD was removed with ring forceps and a sharp curettage was performed. A second look into the endometrial cavity  demonstrated the creation of a false passage. The appropriate anatomic cavity was identified superior to the false passage and 3 subsequent curettages and re-visualizations were performed. Finally, the Mirena IUD was replaced with a uterine forcep. Its appropriate location at the fundus of the endometerial cavity was confirmed with direct visualization with the hysteroscope. This completed the procedure. The patient was taken to the PACU in stable condition following the procedure.

## 2019-10-13 NOTE — Anesthesia Procedure Notes (Signed)
Procedure Name: LMA Insertion Date/Time: 10/13/2019 12:31 PM Performed by: Wanita Chamberlain, CRNA Pre-anesthesia Checklist: Patient identified, Emergency Drugs available, Suction available and Patient being monitored Patient Re-evaluated:Patient Re-evaluated prior to induction Oxygen Delivery Method: Circle system utilized Preoxygenation: Pre-oxygenation with 100% oxygen Induction Type: IV induction Ventilation: Mask ventilation without difficulty LMA: LMA inserted LMA Size: 4.0 Number of attempts: 1 Placement Confirmation: breath sounds checked- equal and bilateral,  CO2 detector and positive ETCO2 Tube secured with: Tape Dental Injury: Teeth and Oropharynx as per pre-operative assessment

## 2019-10-14 ENCOUNTER — Encounter (HOSPITAL_BASED_OUTPATIENT_CLINIC_OR_DEPARTMENT_OTHER): Payer: Self-pay | Admitting: Obstetrics and Gynecology

## 2019-10-14 LAB — SURGICAL PATHOLOGY

## 2023-06-12 ENCOUNTER — Other Ambulatory Visit: Payer: Self-pay | Admitting: Internal Medicine

## 2023-06-12 DIAGNOSIS — R928 Other abnormal and inconclusive findings on diagnostic imaging of breast: Secondary | ICD-10-CM

## 2023-07-02 ENCOUNTER — Ambulatory Visit
Admission: RE | Admit: 2023-07-02 | Discharge: 2023-07-02 | Disposition: A | Discharge status: Home / Self Care | Payer: 59 | Source: Ambulatory Visit | Attending: Internal Medicine | Admitting: Internal Medicine

## 2023-07-02 DIAGNOSIS — R928 Other abnormal and inconclusive findings on diagnostic imaging of breast: Secondary | ICD-10-CM | POA: Insufficient documentation

## 2023-07-02 DIAGNOSIS — Z1231 Encounter for screening mammogram for malignant neoplasm of breast: Secondary | ICD-10-CM | POA: Insufficient documentation

## 2023-07-06 ENCOUNTER — Encounter: Payer: Self-pay | Admitting: Internal Medicine

## 2023-07-08 ENCOUNTER — Other Ambulatory Visit: Payer: Self-pay | Admitting: Internal Medicine

## 2023-07-08 DIAGNOSIS — N6489 Other specified disorders of breast: Secondary | ICD-10-CM

## 2023-07-08 DIAGNOSIS — R928 Other abnormal and inconclusive findings on diagnostic imaging of breast: Secondary | ICD-10-CM

## 2023-07-13 ENCOUNTER — Ambulatory Visit
Admission: RE | Admit: 2023-07-13 | Discharge: 2023-07-13 | Disposition: A | Discharge status: Home / Self Care | Payer: 59 | Source: Ambulatory Visit | Attending: Internal Medicine | Admitting: Internal Medicine

## 2023-07-13 DIAGNOSIS — N6489 Other specified disorders of breast: Secondary | ICD-10-CM | POA: Diagnosis present

## 2023-07-13 DIAGNOSIS — R928 Other abnormal and inconclusive findings on diagnostic imaging of breast: Secondary | ICD-10-CM | POA: Diagnosis present

## 2023-07-14 ENCOUNTER — Other Ambulatory Visit: Payer: Self-pay | Admitting: Internal Medicine

## 2023-07-14 ENCOUNTER — Encounter: Payer: Self-pay | Admitting: Internal Medicine

## 2023-07-14 DIAGNOSIS — R928 Other abnormal and inconclusive findings on diagnostic imaging of breast: Secondary | ICD-10-CM

## 2023-07-14 DIAGNOSIS — N6489 Other specified disorders of breast: Secondary | ICD-10-CM

## 2023-07-20 ENCOUNTER — Other Ambulatory Visit: Payer: BLUE CROSS/BLUE SHIELD

## 2023-07-28 ENCOUNTER — Ambulatory Visit
Admission: RE | Admit: 2023-07-28 | Discharge: 2023-07-28 | Disposition: A | Discharge status: Home / Self Care | Payer: 59 | Source: Ambulatory Visit | Attending: Internal Medicine | Admitting: Internal Medicine

## 2023-07-28 DIAGNOSIS — N6489 Other specified disorders of breast: Secondary | ICD-10-CM

## 2023-07-28 DIAGNOSIS — R928 Other abnormal and inconclusive findings on diagnostic imaging of breast: Secondary | ICD-10-CM

## 2023-07-28 DIAGNOSIS — N6022 Fibroadenosis of left breast: Secondary | ICD-10-CM | POA: Diagnosis present

## 2023-07-28 HISTORY — PX: BREAST BIOPSY: SHX20

## 2023-07-28 MED ORDER — LIDOCAINE-EPINEPHRINE 1 %-1:100000 IJ SOLN
7.0000 mL | Freq: Once | INTRAMUSCULAR | Status: AC
  Administered 2023-07-28: 7 mL
  Filled 2023-07-28: qty 7

## 2023-07-28 MED ORDER — LIDOCAINE 1 % OPTIME INJ - NO CHARGE
2.0000 mL | Freq: Once | INTRAMUSCULAR | Status: AC
  Administered 2023-07-28: 2 mL via INTRADERMAL
  Filled 2023-07-28: qty 2

## 2023-09-09 ENCOUNTER — Ambulatory Visit (INDEPENDENT_AMBULATORY_CARE_PROVIDER_SITE_OTHER): Payer: 59 | Admitting: Internal Medicine

## 2023-09-09 VITALS — BP 125/90 | HR 71 | Resp 16 | Ht 67.0 in | Wt 242.0 lb

## 2023-09-09 DIAGNOSIS — F419 Anxiety disorder, unspecified: Secondary | ICD-10-CM

## 2023-09-09 DIAGNOSIS — I10 Essential (primary) hypertension: Secondary | ICD-10-CM | POA: Diagnosis not present

## 2023-09-09 DIAGNOSIS — E669 Obesity, unspecified: Secondary | ICD-10-CM

## 2023-09-09 DIAGNOSIS — G4730 Sleep apnea, unspecified: Secondary | ICD-10-CM

## 2023-09-09 DIAGNOSIS — F32A Depression, unspecified: Secondary | ICD-10-CM

## 2023-09-09 NOTE — Progress Notes (Signed)
Sleep Medicine   Office Visit  Patient Name: Katherine Suarez DOB: Jun 12, 1971 MRN [id number]    Chief Complaint: OSA  Brief History:  Katherine Suarez presents for an initial consult for sleep evaluation and to establish care. Patient has a longstanding history of sleep apnea, but machine stopped working a few years ago. Sleep quality is fair. This is noted most nights. The patient's bed partner reports  snoring, gasping, and apneic pauses at night. The patient relates the following symptoms: brain fogginess, some fatigue are also present. The patient goes to sleep at 1000 pm and wakes up at 0730 am.  Sleep quality is the same when outside home environment.  Patient has noted no significant movement of her legs at night that would disrupt her sleep.  The patient relates vivid dreams unusual behavior during the night.  The patient relates depression and anxiety as a history of psychiatric problems. The Epworth Sleepiness Score is 9 out of 24 .  The patient relates  Cardiovascular risk factors include: HTN.  ROS  General: (-) fever, (-) chills, (-) night sweat Nose and Sinuses: (-) nasal stuffiness or itchiness, (-) postnasal drip, (-) nosebleeds, (-) sinus trouble. Mouth and Throat: (-) sore throat, (-) hoarseness. Neck: (-) swollen glands, (-) enlarged thyroid, (-) neck pain. Respiratory: - cough, - shortness of breath, - wheezing. Neurologic: - numbness, - tingling. Psychiatric: - anxiety, - depression Sleep behavior: -sleep paralysis -hypnogogic hallucinations -dream enactment      +vivid dreams -cataplexy -night terrors -sleep walking   Current Medication: Outpatient Encounter Medications as of 09/09/2023  Medication Sig   atorvastatin (LIPITOR) 10 MG tablet Take 10 mg by mouth at bedtime.   MOUNJARO 5 MG/0.5ML Pen Inject 0.5 mg into the skin once a week.   amLODipine (NORVASC) 10 MG tablet Take 1 tablet by mouth at bedtime.   citalopram (CELEXA) 40 MG tablet Take 40 mg by mouth at bedtime.    levonorgestrel (MIRENA, 52 MG,) 20 MCG/24HR IUD by Intrauterine route. Inserted 2017   [DISCONTINUED] calcium carbonate (TUMS - DOSED IN MG ELEMENTAL CALCIUM) 500 MG chewable tablet Chew 1 tablet by mouth as needed for indigestion or heartburn.   [DISCONTINUED] ibuprofen (ADVIL) 600 MG tablet Take 1 tablet (600 mg total) by mouth every 6 (six) hours as needed.   [DISCONTINUED] lisinopril (ZESTRIL) 10 MG tablet Take 10 mg by mouth at bedtime.   No facility-administered encounter medications on file as of 09/09/2023.    Surgical History: Past Surgical History:  Procedure Laterality Date   BREAST BIOPSY Left 07/28/2023   [place] Core Bx, ribbon clip- path pending   BREAST BIOPSY Left 07/28/2023   [place] LT BREAST BX W LOC DEV 1ST LESION IMG BX SPEC US GUIDE 07/28/2023 [facility]   CERVICAL POLYPECTOMY N/A 10/13/2019   Procedure: CERVICAL POLYPECTOMY;  Surgeon: Waynard Reeds, MD;  Location: [facility];  Service: Gynecology;  Laterality: N/A;   CESAREAN SECTION  1999   COLONOSCOPY  last one 2017 approx.   ESOPHAGOGASTRODUODENOSCOPY  03/ 2019   care everywhere   HYSTEROSCOPY WITH D & C N/A 10/13/2019   Procedure: DILATATION AND CURETTAGE /HYSTEROSCOPY;  Surgeon: Waynard Reeds, MD;  Location: [facility];  Service: Gynecology;  Laterality: N/A;   LAPAROSCOPIC CHOLECYSTECTOMY  05/29/2008    Medical History: Past Medical History:  Diagnosis Date   Abnormal uterine bleeding (AUB)    Anxiety    Depression    Diverticulosis 11/05/2009   Noted on colonoscopy   GERD (gastroesophageal  reflux disease)    History of syncope    12/ 2019 overnight stay @ [facility],  (record in care everywhere) normal EKG, normal tropnin, telemetry monitor no no sig. arrhymthia    Hypertension    10-12-2019  per pt has had a ETT and nuclear stress test approx. 2017 and 2018 was told normal   OSA (obstructive sleep apnea)    per pt moderate , cpap intolerant   Type 2 diabetes mellitus (HCC)     10-12-2019 per pt followed by pcp , Dr Darrick Meigs @ [facility] in [place]),  per pt had taken any medication for past year and pt states does not do cbg checks    Family History: Non contributory to the present illness  Social History: Social History   Socioeconomic History   Marital status: Single    Spouse name: Not on file   Number of children: 1   Years of education: Not on file   Highest education level: Not on file  Occupational History   Not on file  Tobacco Use   Smoking status: Former    Current packs/day: 0.00    Types: Cigarettes    Quit date: 10/11/2014    Years since quitting: 8.9   Smokeless tobacco: Never   Tobacco comments:    social smoker  Vaping Use   Vaping status: Never Used  Substance and Sexual Activity   Alcohol use: Yes    Comment: social   Drug use: No   Sexual activity: Not on file  Other Topics Concern   Not on file  Social History Narrative   Not on file   Social Determinants of Health   Financial Resource Strain: Not on file  Food Insecurity: Not on file  Transportation Needs: Not on file  Physical Activity: Not on file  Stress: Not on file  Social Connections: Not on file  Intimate Partner Violence: Not on file    Vital Signs: Blood pressure (!) 125/90, pulse 71, resp. rate 16, height 5\' 7"  (1.702 m), weight 242 lb (109.8 kg). Body mass index is 37.9 kg/m.   Examination: General Appearance: The patient is well-developed, well-nourished, and in no distress. Neck Circumference: 52 cm Skin: Gross inspection of skin unremarkable. Head: normocephalic, no gross deformities. Eyes: no gross deformities noted. ENT: ears appear grossly normal Neurologic: Alert and oriented. No involuntary movements.    STOP BANG RISK ASSESSMENT S (snore) Have you been told that you snore?     YES   T (tired) Are you often tired, fatigued, or sleepy during the day?   YES  O (obstruction) Do you stop breathing, choke, or gasp  during sleep? YES   P (pressure) Do you have or are you being treated for high blood pressure? YES   B (BMI) Is your body index greater than 35 kg/m? YES   A (age) Are you 10 years old or older? YES   N (neck) Do you have a neck circumference greater than 16 inches?   YES   G (gender) Are you a female? NO   TOTAL STOP/BANG "YES" ANSWERS 7                                                               A STOP-Bang score of 2  or less is considered low risk, and a score of 5 or more is high risk for having either moderate or severe OSA. For people who score 3 or 4, doctors may need to perform further assessment to determine how likely they are to have OSA.         EPWORTH SLEEPINESS SCALE:  Scale:  (0)= no chance of dozing; (1)= slight chance of dozing; (2)= moderate chance of dozing; (3)= high chance of dozing  Chance  Situtation    Sitting and reading: 3    Watching TV: 3    Sitting Inactive in public: 0    As a passenger in car: 0      Lying down to rest: 3    Sitting and talking: 0    Sitting quielty after lunch: 0    In a car, stopped in traffic: 0   TOTAL SCORE:   9 out of 24    SLEEP STUDIES:  None   LABS: No results found for this or any previous visit (from the past 2160 hour(s)).  Radiology: [place] LT BREAST BX W LOC DEV 1ST LESION IMG BX SPEC US GUIDE  Addendum Date: 07/29/2023   ADDENDUM REPORT: 07/29/2023 13:31 ADDENDUM: PATHOLOGY revealed: A. Breast, left, needle core biopsy, 4:00, 12 cmfn, ribbon clip - BENIGN BREAST PARENCHYMA WITH FIBROADENOMATOID CHANGE AND FOCAL ADENOSIS - NEGATIVE FOR MALIGNANCY. Pathology results are CONCORDANT with imaging findings, per Dr. Meda Klinefelter. Pathology results and recommendations were discussed with patient via telephone on 07/29/2023. Patient reported biopsy site doing well with no adverse symptoms, and only slight tenderness at the site. Post biopsy care instructions were reviewed, questions were answered and my  direct phone number was provided. Patient was instructed to call [facility] for any additional questions or concerns related to biopsy site. RECOMMENDATION: Patient to return in six months for unilateral LEFT breast diagnostic mammogram and ultrasound to ensure stability of the mammographically visualized LEFT breast asymmetry. Patient informed a reminder notice will be sent regarding this appointment and she will need to call mammography site to schedule this appointment. Pathology results reported by Randa Lynn RN on 07/29/2023. Electronically Signed   By: Meda Klinefelter M.D.   On: 07/29/2023 13:31   Result Date: 07/29/2023 CLINICAL DATA:  Indeterminate non mass area EXAM: ULTRASOUND GUIDED LEFT BREAST CORE NEEDLE BIOPSY COMPARISON:  Previous exam(s). PROCEDURE: I met with the patient and we discussed the procedure of ultrasound-guided biopsy, including benefits and alternatives. We discussed the high likelihood of a successful procedure. We discussed the risks of the procedure, including infection, bleeding, tissue injury, clip migration, and inadequate sampling. Informed written consent was given. The usual time-out protocol was performed immediately prior to the procedure. Lesion quadrant: Lower outer quadrant Using sterile technique and 1% lidocaine and 1% lidocaine with epinephrine as local anesthetic, under direct ultrasound visualization, a 14 gauge spring-loaded device was used to perform biopsy of a non mass area at 4 o'clock 12 cm from the nipple using a lateral approach. At the conclusion of the procedure a RIBBON shaped tissue marker clip was deployed into the biopsy cavity. Follow up 2 view mammogram was performed and dictated separately. This demonstrates that the sonographic finding does not correspond to the site of mammographic concern. IMPRESSION: 1. Ultrasound guided biopsy of a sonographically identified LEFT breast non-mass area. No apparent complications. 2. Unfortunately,  subsequent two-view mammogram demonstrates at the non mass area does not correspond to the site of mammographic concern. With benign biopsy  results, recommend dedicated follow-up diagnostic mammogram in 6 months given this area is favored to have been excluded from the field of view on remote prior mammograms and may contain internal fat density. Option for short term follow-up versus definitive characterization with discussed with patient. This was discussed with patient who is in agreement with short term follow-up with benign biopsy results. Electronically Signed: By: Meda Klinefelter M.D. On: 07/28/2023 15:21   MM CLIP PLACEMENT LEFT  Result Date: 07/28/2023 CLINICAL DATA:  Status post ultrasound-guided biopsy. EXAM: 3D DIAGNOSTIC LEFT MAMMOGRAM POST ULTRASOUND BIOPSY COMPARISON:  Previous exam(s). FINDINGS: 3D Mammographic images were obtained following ultrasound guided biopsy of a LEFT breast non-mass area at 4 o'clock 12 cm from the nipple. The RIBBON biopsy marking clip is in expected position at the site of biopsy. However, this does not correspond to the subtle asymmetry seen on CC view only. Subtle asymmetry is seen on CC slice 64 and is located further posterior and slightly superior in the site of biopsy. A suggestion of internal fat density is noted. This is favored to have been excluded from the field of view on remote prior 2D mammograms. IMPRESSION: RIBBON shaped biopsy marking clip is at the site of biopsy in the LEFT lower outer breast. However, the sonographic finding does not correspond to the site of mammographic concern. With benign biopsy results, recommend dedicated follow-up diagnostic mammogram in 6 months given this area is favored to have been excluded from the field of view on remote prior mammograms and may contain internal fat density. Option for short term follow-up versus definitive characterization with discussed with patient. This was discussed with patient who is in  agreement with short term follow-up with benign biopsy results. Final Assessment: Post Procedure Mammograms for Marker Placement Electronically Signed   By: Meda Klinefelter M.D.   On: 07/28/2023 15:17    No results found.  No results found.    Assessment and Plan: Patient Active Problem List   Diagnosis Date Noted   Body mass index (BMI) of 38.0 to 38.9 in adult 01/26/2016   Chest pain 08/16/2015   Adaptation reaction 08/16/2015   CD (contact dermatitis) 08/16/2015   Sleep apnea in adult 08/16/2015   Clinical depression 08/16/2015   Diabetes mellitus, type 2 (HCC) 08/16/2015   DD (diverticular disease) 08/16/2015   Enterogastritis 08/16/2015   BP (high blood pressure) 08/16/2015   Adaptive colitis 08/16/2015   Adiposity 08/16/2015   Panic attack 08/16/2015   Breath shortness 08/16/2015   Apnea, sleep 08/16/2015   Anxiety 06/14/2015     PLAN OSA:   Patient evaluation suggests high risk of sleep disordered breathing due to hx of OSA, snoring, gasping, apneic pauses, brain fogginess, some fatigue, and elevated BMI. Patient has comorbid cardiovascular risk factors including: HTN which could be exacerbated by pathologic sleep-disordered breathing.  Suggest: PSG to assess/treat the patient's sleep disordered breathing. The patient was also counselled on wt loss to optimize sleep health.   1. Sleep apnea in adult Will order PSG to re-evaluate  2. Essential hypertension Continue current medication and f/u with PCP.  3. Anxiety and depression Continue current medication and f/u with PCP.  4. Obesity (BMI 30-39.9) Obesity Counseling: Had a lengthy discussion regarding patients BMI and weight issues. Patient was instructed on portion control as well as increased activity. Also discussed caloric restrictions with trying to maintain intake less than 2000 Kcal. Discussions were made in accordance with the 5As of weight management. Simple actions such as not  eating late and if  able to, taking a walk is suggested.    General Counseling: I have discussed the findings of the evaluation and examination with Azaylah.  I have also discussed any further diagnostic evaluation thatmay be needed or ordered today. Syrena verbalizes understanding of the findings of todays visit. We also reviewed her medications today and discussed drug interactions and side effects including but not limited excessive drowsiness and altered mental states. We also discussed that there is always a risk not just to her but also people around her. she has been encouraged to call the office with any questions or concerns that should arise related to todays visit.  No orders of the defined types were placed in this encounter.       I have personally obtained a history, evaluated the patient, evaluated pertinent data, formulated the assessment and plan and placed orders.  This patient was seen by Lynn Ito, PA-C in collaboration with Dr. Freda Munro as a part of collaborative care agreement.    Yevonne Pax, MD [facility] Diplomate ABMS Pulmonary and Critical Care Medicine Sleep medicine

## 2023-11-14 ENCOUNTER — Encounter (INDEPENDENT_AMBULATORY_CARE_PROVIDER_SITE_OTHER): Payer: 59 | Admitting: Internal Medicine

## 2023-11-14 DIAGNOSIS — G4733 Obstructive sleep apnea (adult) (pediatric): Secondary | ICD-10-CM

## 2023-11-23 NOTE — Procedures (Signed)
[facility]  Polysomnogram Report Part I  Phone: [phone] Fax: [phone]  Patient Name: Katherine Suarez, Katherine Suarez. Acquisition Number: [id number]  Date of Birth: 07/28/1971 Acquisition Date: 11/14/2023  Referring Physician: Lafe Garin MD     History: The patient is a 52 year old  . Medical History: hypertension, anxiety, depression.  Medications: atorvastatin, Mounjaro, citalopram, Mirena.  Procedure: This routine overnight polysomnogram was performed on the [facility] 5 using the standard CPAP protocol. This included 6 channels of EEG, 2 channels of EOG, chin EMG, bilateral anterior tibialis EMG, nasal/oral thermistor, PTAF (nasal pressure transducer), chest and abdominal wall movements, EKG, and pulse oximetry.  Description: The total recording time was 391.0 minutes. The total sleep time was 337.0 minutes. There were a total of 26.5 minutes of wakefulness after sleep onset for areducedsleep efficiency of 86.2%. The latency to sleep onset was within normal limitsat 27.5 minutes. The R sleep onset latency was within normal limits at 72.0 minutes. Sleep parameters, as a percentage of the total sleep time, demonstrated 2.7% of sleep was in N1 sleep, 34.7% N2, 17.4% N3 and 45.3% R sleep. There were a total of 29 arousals for an arousal index of 5.2 arousals per hour of sleep that was normal.  No respiratory events were observed during the pressure titration.  was initiated at 4 cm H2O at lights out, 11:18 p.m. It was titrated in 1-2 cm increments to the final pressure of 13 cm H2O. Supine, REM sleep was observed.  Additionally, the baseline oxygen saturation during wakefulness was 100%, during NREM sleep averaged 97%, and during REM sleep averaged 99%. The total duration of oxygen < 90% was 0.1 minutes.  Cardiac monitoring-  significant cardiac rhythm irregularities.   Periodic limb movement monitoring- demonstrated that there were 12 periodic limb movements for a periodic limb movement  index of 2.1 periodic limb movements per hour of sleep.   Impression: This patient's obstructive sleep apnea demonstrated significant improvement with the utilization of nasal  at all pressures 4-13 cm H2O. Supine, REM sleep was observed at 10 cm H2O.  There were few periodic limb movements that commonly are not significant. Clinical correlation would be suggested.   Recommendations: Would recommend utilization of nasal  at 10 cm H2O.      An AirFit P10 mask, size Extra Small , was used. Chin strap used during study- No. Humidifier used during study- Yes.     Yevonne Pax, MD, [facility] Diplomate ABMS-Pulmonary, Critical Care and Sleep Medicine  Electronically reviewed and digitally signed  [facility] CPAP/BIPAP Polysomnogram Report Part II Phone: [phone] Fax: [phone]  Patient last name Meunier Neck Size    in. Acquisition [id number]  Patient first name Katherine D. Weight 242.0 lbs. Started 11/14/2023 at 11:09:52 PM  Birth date [birth year] Height 67.0 in. Stopped 11/15/2023 at 5:52:34 AM  Age 41      Type Adult BMI 37.9 lb/in2 Duration 391.0  Otho Perl. RPSGT. / Loura Back.  Reviewed by: Valentino Hue Henke, PhD, ABSM, FAASM Sleep Data: Lights Out: 11:18:52 PM Sleep Onset: 11:46:22 PM  Lights On: 5:49:52 AM Sleep Efficiency: 86.2 %  Total Recording Time: 391.0 min Sleep Latency (from Lights Off) 27.5 min  Total Sleep Time (TST): 337.0 min R Latency (from Sleep Onset): 72.0 min  Sleep Period Time: 362.5 min Total number of awakenings: 5  Wake during sleep: 25.5 min Wake After Sleep Onset (WASO): 26.5 min   Sleep Data:  Arousal Summary: Stage  Latency from lights out (min) Latency from sleep onset (min) Duration (min) % Total Sleep Time  Normal values  N 1 30.5 3.0 9.0 2.7 (5%)  N 2 27.5 0.0 117.0 34.7 (50%)  N 3 40.5 13.0 58.5 17.4 (20%)  R 99.5 72.0 152.5 45.3 (25%)   Number Index  Spontaneous 29 5.2  Apneas & Hypopneas 0 0.0  RERAs 0 0.0        (Apneas & Hypopneas & RERAs)  (0) (0.0)  Limb Movement 0 0.0  Snore 0 0.0  TOTAL 29 5.2     Respiratory Data:  CA OA MA Apnea Hypopnea* A+ H RERA Total  Number 0 0 0 0 0 0 0 0  Mean Dur (sec) 0.0 0.0 0.0 0.0 0.0 0.0 0.0 0.0  Max Dur (sec) 0.0 0.0 0.0 0.0 0.0 0.0 0.0 0.0  Total Dur (min) 0.0 0.0 0.0 0.0 0.0 0.0 0.0 0.0  % of TST 0.0 0.0 0.0 0.0 0.0 0.0 0.0 0.0  Index (#/h TST) 0.0 0.0 0.0 0.0 0.0 0.0 0.0 0.0  *Hypopneas scored based on 4% or greater desaturation.  Sleep Stage:         REM NREM TST  AHI 0.0 0.0 0.0  RDI 0.0 0.0 0.0   Sleep (min) TST (%) REM (min) NREM (min) CA (#) OA (#) MA (#) HYP (#) AHI (#/h) RERA (#) RDI (#/h) Desat (#)  Supine 235.5 69.88 110.0 125.5 0 0 0 0 0.0 0 0.00 52  Non-Supine 101.50 30.12 42.50 59.00 0.00 0.00 0.00 0.00 0.00 0 0.00 11.00  Left: 101.5 30.12 42.5 59.0 0 0 0 0 0.0 0 0.00 11     Snoring: Total number of snoring episodes  0  Total time with snoring    min (   % of sleep)   Oximetry Distribution:             WK REM NREM TOTAL  Average (%)   100 99 97 98  < 90% 0.1 0.0 0.0 0.1  < 80% 0.0 0.0 0.0 0.0  < 70% 0.0 0.0 0.0 0.0  # of Desaturations* 1 18 44 63  Desat Index (#/hour) 1.1 7.1 14.4 11.3  Desat Max (%) 5 14 10 14   Desat Max Dur (sec) 17.0 49.0 107.0 107.0  Approx Min O2 during sleep 86  Approx min O2 during a respiratory event     Was Oxygen added (Y/N) and final rate :    LPM  *Desaturations based on 3% or greater drop from baseline.   Cheyne Stokes Breathing: None Present    Heart Rate Summary:  Average Heart Rate During Sleep 77.2 bpm      Highest Heart Rate During Sleep (95th %) 83.0 bpm      Highest Heart Rate During Sleep 107 bpm      Highest Heart Rate During Recording (TIB) 197 bpm (artifact)   Heart Rate Observations: Event Type # Events   Bradycardia 0 Lowest HR Scored: N/A  Sinus Tachycardia During Sleep 0 Highest HR Scored: N/A  Narrow Complex Tachycardia 0 Highest HR Scored: N/A  Wide  Complex Tachycardia 0 Highest HR Scored: N/A  Asystole 0 Longest Pause: N/A  Atrial Fibrillation 0 Duration Longest Event: N/A  Other Arrythmias   Type:   Periodic Limb Movement Data: (Primary legs unless otherwise noted) Total # Limb Movement 18 Limb Movement Index 3.2  Total # PLMS 12 PLMS Index 2.1  Total # PLMS Arousals  PLMS Arousal Index     Percentage Sleep Time with PLMS 6.29min (1.9 % sleep)  Mean Duration limb movements (secs) 187.3    IPAP Level (cmH2O) EPAP Level (cmH2O) Total Duration (min) Sleep Duration (min) Sleep (%) REM (%) CA  #) OA # MA # HYP #) AHI (#/hr) RERAs # RERAs (#/hr) RDI (#/hr)  4 0 4.8 4.8 100.0 0.0 0 0 0 0 0.0 0 0.0 0.0  5 0 9.9 9.9 100.0 0.0 0 0 0 0 0.0 0 0.0 0.0  6 0 13.8 13.8 100.0 0.0 0 0 0 0 0.0 0 0.0 0.0  7 0 9.9 9.9 100.0 0.0 0 0 0 0 0.0 0 0.0 0.0  8 0 10.9 10.9 100.0 0.0 0 0 0 0 0.0 0 0.0 0.0  9 0 14.7 14.7 100.0 0.0 0 0 0 0 0.0 0 0.0 0.0  10 0 12.1 12.1 100.0 33.9 0 0 0 0 0.0 0 0.0 0.0  11 0 55.5 50.5 91.0 15.1 0 0 0 0 0.0 0 0.0 0.0  12 0 149.5 130.0 87.0 40.5 0 0 0 0 0.0 0 0.0 0.0  13 0 81.4 80.4 98.8 97.5 0 0 0 0 0.0 0 0.0 0.0

## 2024-02-23 ENCOUNTER — Other Ambulatory Visit: Payer: Self-pay | Admitting: Internal Medicine

## 2024-02-23 DIAGNOSIS — R928 Other abnormal and inconclusive findings on diagnostic imaging of breast: Secondary | ICD-10-CM

## 2024-04-01 ENCOUNTER — Ambulatory Visit
Admission: RE | Admit: 2024-04-01 | Discharge: 2024-04-01 | Disposition: A | Discharge status: Home / Self Care | Source: Ambulatory Visit | Attending: Internal Medicine | Admitting: Internal Medicine

## 2024-04-01 DIAGNOSIS — R928 Other abnormal and inconclusive findings on diagnostic imaging of breast: Secondary | ICD-10-CM | POA: Insufficient documentation

## 2024-04-01 DIAGNOSIS — R92322 Mammographic fibroglandular density, left breast: Secondary | ICD-10-CM | POA: Diagnosis not present

## 2024-06-10 ENCOUNTER — Encounter: Payer: Self-pay | Admitting: Advanced Practice Midwife
